# Patient Record
Sex: Female | Born: 1935 | Race: Black or African American | Hispanic: No | Smoking: Former smoker
Health system: Southern US, Community
[De-identification: ages and names within clinical notes are randomized; demographics above are authoritative.]

## PROBLEM LIST (undated history)

## (undated) DIAGNOSIS — K5900 Constipation, unspecified: Secondary | ICD-10-CM

## (undated) DIAGNOSIS — I10 Essential (primary) hypertension: Secondary | ICD-10-CM

## (undated) DIAGNOSIS — M1990 Unspecified osteoarthritis, unspecified site: Secondary | ICD-10-CM

## (undated) HISTORY — PX: CATARACT EXTRACTION, BILATERAL: SHX1313

## (undated) HISTORY — PX: ABDOMINAL HYSTERECTOMY: SHX81

---

## 2004-12-07 ENCOUNTER — Emergency Department: Payer: Self-pay | Admitting: Emergency Medicine

## 2005-06-30 ENCOUNTER — Ambulatory Visit: Payer: Self-pay | Admitting: Family Medicine

## 2006-03-26 ENCOUNTER — Ambulatory Visit: Payer: Self-pay | Admitting: Ophthalmology

## 2006-03-30 ENCOUNTER — Ambulatory Visit: Payer: Self-pay | Admitting: Ophthalmology

## 2006-11-04 ENCOUNTER — Ambulatory Visit: Payer: Self-pay | Admitting: Family Medicine

## 2007-08-22 ENCOUNTER — Ambulatory Visit: Payer: Self-pay | Admitting: Family Medicine

## 2008-06-29 ENCOUNTER — Emergency Department: Payer: Self-pay | Admitting: Internal Medicine

## 2008-06-29 ENCOUNTER — Other Ambulatory Visit: Payer: Self-pay

## 2009-06-26 ENCOUNTER — Ambulatory Visit: Payer: Self-pay | Admitting: Gastroenterology

## 2010-01-06 ENCOUNTER — Ambulatory Visit: Payer: Self-pay | Admitting: Family Medicine

## 2010-10-29 ENCOUNTER — Ambulatory Visit: Payer: Self-pay | Admitting: Family Medicine

## 2010-11-09 ENCOUNTER — Observation Stay: Payer: Self-pay | Admitting: Internal Medicine

## 2012-03-18 ENCOUNTER — Ambulatory Visit: Payer: Self-pay | Admitting: Family Medicine

## 2013-03-21 ENCOUNTER — Ambulatory Visit: Payer: Self-pay | Admitting: Family Medicine

## 2013-11-28 ENCOUNTER — Ambulatory Visit: Payer: Self-pay | Admitting: Family Medicine

## 2014-09-14 ENCOUNTER — Emergency Department: Payer: Self-pay | Admitting: Emergency Medicine

## 2014-10-09 ENCOUNTER — Ambulatory Visit: Payer: Self-pay | Admitting: Family Medicine

## 2015-02-09 ENCOUNTER — Emergency Department: Payer: Self-pay | Admitting: Emergency Medicine

## 2015-08-26 ENCOUNTER — Emergency Department
Admission: EM | Admit: 2015-08-26 | Discharge: 2015-08-26 | Disposition: A | Discharge status: Home / Self Care | Payer: Medicare Other | Attending: Emergency Medicine | Admitting: Emergency Medicine

## 2015-08-26 ENCOUNTER — Encounter: Payer: Self-pay | Admitting: Emergency Medicine

## 2015-08-26 ENCOUNTER — Emergency Department: Payer: Medicare Other

## 2015-08-26 DIAGNOSIS — Z87891 Personal history of nicotine dependence: Secondary | ICD-10-CM | POA: Diagnosis not present

## 2015-08-26 DIAGNOSIS — I10 Essential (primary) hypertension: Secondary | ICD-10-CM | POA: Diagnosis not present

## 2015-08-26 DIAGNOSIS — N390 Urinary tract infection, site not specified: Secondary | ICD-10-CM | POA: Insufficient documentation

## 2015-08-26 DIAGNOSIS — K5900 Constipation, unspecified: Secondary | ICD-10-CM | POA: Diagnosis present

## 2015-08-26 HISTORY — DX: Constipation, unspecified: K59.00

## 2015-08-26 HISTORY — DX: Essential (primary) hypertension: I10

## 2015-08-26 LAB — CBC WITH DIFFERENTIAL/PLATELET
BASOS ABS: 0.1 10*3/uL (ref 0–0.1)
Basophils Relative: 1 %
Eosinophils Absolute: 0.1 10*3/uL (ref 0–0.7)
Eosinophils Relative: 1 %
HCT: 39.9 % (ref 35.0–47.0)
HEMOGLOBIN: 13 g/dL (ref 12.0–16.0)
LYMPHS PCT: 34 %
Lymphs Abs: 2.1 10*3/uL (ref 1.0–3.6)
MCH: 27.6 pg (ref 26.0–34.0)
MCHC: 32.5 g/dL (ref 32.0–36.0)
MCV: 85 fL (ref 80.0–100.0)
Monocytes Absolute: 0.4 10*3/uL (ref 0.2–0.9)
Monocytes Relative: 7 %
NEUTROS PCT: 57 %
Neutro Abs: 3.5 10*3/uL (ref 1.4–6.5)
Platelets: 254 10*3/uL (ref 150–440)
RBC: 4.69 MIL/uL (ref 3.80–5.20)
RDW: 14.6 % — ABNORMAL HIGH (ref 11.5–14.5)
WBC: 6.1 10*3/uL (ref 3.6–11.0)

## 2015-08-26 LAB — BASIC METABOLIC PANEL
ANION GAP: 8 (ref 5–15)
BUN: 15 mg/dL (ref 6–20)
CHLORIDE: 102 mmol/L (ref 101–111)
CO2: 30 mmol/L (ref 22–32)
Calcium: 11.9 mg/dL — ABNORMAL HIGH (ref 8.9–10.3)
Creatinine, Ser: 0.97 mg/dL (ref 0.44–1.00)
GFR, EST AFRICAN AMERICAN: 57 mL/min — AB (ref >60)
GFR, EST NON AFRICAN AMERICAN: 50 mL/min — AB (ref >60)
Glucose, Bld: 98 mg/dL (ref 65–99)
POTASSIUM: 3.6 mmol/L (ref 3.5–5.1)
SODIUM: 140 mmol/L (ref 135–145)

## 2015-08-26 LAB — URINALYSIS COMPLETE WITH MICROSCOPIC (ARMC ONLY)
Bilirubin Urine: NEGATIVE
GLUCOSE, UA: NEGATIVE mg/dL
Ketones, ur: NEGATIVE mg/dL
NITRITE: POSITIVE — AB
Protein, ur: NEGATIVE mg/dL
Specific Gravity, Urine: 1.014 (ref 1.005–1.030)
pH: 6 (ref 5.0–8.0)

## 2015-08-26 MED ORDER — CEPHALEXIN 500 MG PO CAPS: 500.0000 mg | ORAL_CAPSULE | Freq: Three times a day (TID) | ORAL | Status: AC

## 2015-08-26 MED ORDER — FLEET ENEMA 7-19 GM/118ML RE ENEM
1.0000 | ENEMA | Freq: Once | RECTAL | Status: AC
  Administered 2015-08-26: 1 via RECTAL

## 2015-08-26 NOTE — Discharge Instructions (Signed)
Constipation Constipation is when a person:  Poops (has a bowel movement) less than 3 times a week.  Has a hard time pooping.  Has poop that is dry, hard, or bigger than normal. HOME CARE   Eat foods with a lot of fiber in them. This includes fruits, vegetables, beans, and whole grains such as brown rice.  Avoid fatty foods and foods with a lot of sugar. This includes french fries, hamburgers, cookies, candy, and soda.  If you are not getting enough fiber from food, take products with added fiber in them (supplements).  Drink enough fluid to keep your pee (urine) clear or pale yellow.  Exercise on a regular basis, or as told by your doctor.  Go to the restroom when you feel like you need to poop. Do not hold it.  Only take medicine as told by your doctor. Do not take medicines that help you poop (laxatives) without talking to your doctor first. GET HELP RIGHT AWAY IF:   You have bright red blood in your poop (stool).  Your constipation lasts more than 4 days or gets worse.  You have belly (abdominal) or butt (rectal) pain.  You have thin poop (as thin as a pencil).  You lose weight, and it cannot be explained. MAKE SURE YOU:   Understand these instructions.  Will watch your condition.  Will get help right away if you are not doing well or get worse. Document Released: 05/04/2008 Document Revised: 11/21/2013 Document Reviewed: 08/28/2013 [facility] Patient Information 2015 [place], [place]. This information is not intended to replace advice given to you by your health care provider. Make sure you discuss any questions you have with your health care provider.  Urinary Tract Infection A urinary tract infection (UTI) can occur any place along the urinary tract. The tract includes the kidneys, ureters, bladder, and urethra. A type of germ called bacteria often causes a UTI. UTIs are often helped with antibiotic medicine.  HOME CARE   If given, take antibiotics as told by your  doctor. Finish them even if you start to feel better.  Drink enough fluids to keep your pee (urine) clear or pale yellow.  Avoid tea, drinks with caffeine, and bubbly (carbonated) drinks.  Pee often. Avoid holding your pee in for a long time.  Pee before and after having sex (intercourse).  Wipe from front to back after you poop (bowel movement) if you are a woman. Use each tissue only once. GET HELP RIGHT AWAY IF:   You have back pain.  You have lower belly (abdominal) pain.  You have chills.  You feel sick to your stomach (nauseous).  You throw up (vomit).  Your burning or discomfort with peeing does not go away.  You have a fever.  Your symptoms are not better in 3 days. MAKE SURE YOU:   Understand these instructions.  Will watch your condition.  Will get help right away if you are not doing well or get worse. Document Released: 05/04/2008 Document Revised: 08/10/2012 Document Reviewed: 06/16/2012 [facility] Patient Information 2015 [place], [place]. This information is not intended to replace advice given to you by your health care provider. Make sure you discuss any questions you have with your health care provider.

## 2015-08-26 NOTE — ED Notes (Signed)
Pt to ed with c/o constipation x 2 days.  Per family pt does not eat frequently.  Pt reports she has tried over the counter meds but without relief of constipation.

## 2015-08-26 NOTE — ED Provider Notes (Signed)
Time Seen: Approximately ----------------------------------------- 3:27 PM on 08/26/2015 -----------------------------------------     I have reviewed the triage notes  Chief Complaint: Constipation   History of Present Illness: Kimberly Stokes is a 81 y.o. female who presents with a 2 day history of constipation. She states she normally has a bowel movement once a day and has some feelings of constipation. Patient's on MiraLAX and has also noticed some decreased urinary frequency and maybe some mild pain with urination. No fever, only mild right-sided lower abdominal pain. Patient denies any recent weight loss or night sweats. She denies any back or flank pain. She denies any weakness in either upper or lower extremities. She denies any nausea, vomiting. Past Medical History  Diagnosis Date  . Constipation   . Hypertension     There are no active problems to display for this patient.   History reviewed. No pertinent past surgical history.  History reviewed. No pertinent past surgical history.  No current outpatient prescriptions on file.  Allergies:  Review of patient's allergies indicates no known allergies.  Family History: History reviewed. No pertinent family history.  Social History: Social History  Substance Use Topics  . Smoking status: Former Games developer  . Smokeless tobacco: None  . Alcohol Use: No     Review of Systems:   10 point review of systems was performed and was otherwise negative:  Constitutional: No fever Eyes: No visual disturbances ENT: No sore throat, ear pain Cardiac: No chest pain Respiratory: No shortness of breath, wheezing, or stridor Abdomen: No abdominal pain, no vomiting, No diarrhea Endocrine: No weight loss, No night sweats Extremities: No peripheral edema, cyanosis Skin: No rashes, easy bruising Neurologic: No focal weakness, trouble with speech or swollowing Urologic: No dysuria, Hematuria, or urinary frequency   Physical  Exam:  ED Triage Vitals  Enc Vitals Group     BP 08/26/15 1204 143/99 mmHg     Pulse Rate 08/26/15 1204 57     Resp 08/26/15 1204 20     Temp 08/26/15 1204 97.7 F (36.5 C)     Temp Source 08/26/15 1204 Oral     SpO2 08/26/15 1204 99 %     Weight 08/26/15 1204 119 lb (53.978 kg)     Height 08/26/15 1204  (1.651 m)     Head Cir --      Peak Flow --      Pain Score 08/26/15 1205 0     Pain Loc --      Pain Edu? --      Excl. in GC? --     General: Awake , Alert , and Oriented times 3; GCS 15 Head: Normal cephalic , atraumatic Eyes: Pupils equal , round, reactive to light Nose/Throat: No nasal drainage, patent upper airway without erythema or exudate.  Neck: Supple, Full range of motion, No anterior adenopathy or palpable thyroid masses Lungs: Clear to ascultation without wheezes , rhonchi, or rales Heart: Regular rate, regular rhythm without murmurs , gallops , or rubs Abdomen: Soft, mild tenderness to deep palpation without rebound, guarding , or rigidity; bowel sounds positive and symmetric in all 4 quadrants. No organomegaly .       No palpable masses. Extremities: 2 plus symmetric pulses. No edema, clubbing or cyanosis Neurologic: normal ambulation, Motor symmetric without deficits, sensory intact Skin: warm, dry, no rashes   Labs:   All laboratory work was reviewed including any pertinent negatives or positives listed below:  Labs Reviewed  URINALYSIS COMPLETEWITH  MICROSCOPIC ([facility] ONLY) - Abnormal; Notable for the following:    Color, Urine YELLOW (*)    APPearance CLEAR (*)    Hgb urine dipstick 1+ (*)    Nitrite POSITIVE (*)    Leukocytes, UA TRACE (*)    Bacteria, UA RARE (*)    Squamous Epithelial / LPF 0-5 (*)    All other components within normal limits  URINE CULTURE  BASIC METABOLIC PANEL  CBC WITH DIFFERENTIAL/PLATELET   reviewed the laboratory work shows an elevated calcium. Otherwise all within normal limits She does have findings consistent  with a urinary tract infection and a urine culture was added  Radiology:   EXAM: ABDOMEN - 2 VIEW  COMPARISON: None.  FINDINGS: Supine and upright images obtained. There is moderate stool in colon. There is no bowel dilatation or air-fluid level suggesting obstruction. No free air. There are multiple laminated gallstones in the gallbladder. Lung bases clear. There is degenerative change in the lumbar spine.  IMPRESSION: Laminated gallstones in the gallbladder. Note that the gallbladder is rather mobile given the difference in position of the laminated gallstones between supine and upright images. The bowel gas pattern overall is unremarkable. There is moderate stool in the colon.      I personally reviewed the radiologic studies   Procedures:  Patient received a fleets enema with some normal bowel movement here in emergency department. There was no blood etc. Patient felt symptomatically improved     ED Course:  Patient's stay here was uneventful and I felt her abdominal pain was not secondary to a surgical etiology. Patient does have a mild urinary tract infection will be started on Keflex on an outpatient basis with urine culture pending. Patient's constipation I does not seem to be neurologic in nature though of concern is a slightly elevated calcium level. Patient may need some follow-up for colonoscopy.    Assessment: 1. Acute urinary tract infection 2. Constipation 3. Hypercalcemia   Final Clinical Impression:   Final diagnoses:  Constipation     Plan:  Outpatient management Patient was advised to return immediately if condition worsens. Patient was advised to follow up with her primary care physician or other specialized physicians involved and in their current assessment.             Jennye Moccasin, MD 08/26/15 [phone]

## 2015-08-28 LAB — URINE CULTURE

## 2016-08-12 ENCOUNTER — Encounter: Payer: Self-pay | Admitting: Emergency Medicine

## 2016-08-12 ENCOUNTER — Emergency Department
Admission: EM | Admit: 2016-08-12 | Discharge: 2016-08-12 | Disposition: A | Discharge status: Home / Self Care | Payer: Medicare Other | Attending: Emergency Medicine | Admitting: Emergency Medicine

## 2016-08-12 DIAGNOSIS — R34 Anuria and oliguria: Secondary | ICD-10-CM | POA: Insufficient documentation

## 2016-08-12 DIAGNOSIS — Z87891 Personal history of nicotine dependence: Secondary | ICD-10-CM | POA: Insufficient documentation

## 2016-08-12 DIAGNOSIS — I10 Essential (primary) hypertension: Secondary | ICD-10-CM | POA: Insufficient documentation

## 2016-08-12 DIAGNOSIS — R3989 Other symptoms and signs involving the genitourinary system: Secondary | ICD-10-CM

## 2016-08-12 HISTORY — DX: Unspecified osteoarthritis, unspecified site: M19.90

## 2016-08-12 LAB — URINALYSIS COMPLETE WITH MICROSCOPIC (ARMC ONLY)
BACTERIA UA: NONE SEEN
BILIRUBIN URINE: NEGATIVE
Glucose, UA: NEGATIVE mg/dL
HGB URINE DIPSTICK: NEGATIVE
Ketones, ur: NEGATIVE mg/dL
LEUKOCYTES UA: NEGATIVE
NITRITE: NEGATIVE
PH: 5 (ref 5.0–8.0)
PROTEIN: NEGATIVE mg/dL
Specific Gravity, Urine: 1.009 (ref 1.005–1.030)

## 2016-08-12 LAB — BASIC METABOLIC PANEL
ANION GAP: 6 (ref 5–15)
BUN: 22 mg/dL — ABNORMAL HIGH (ref 6–20)
CALCIUM: 10.6 mg/dL — AB (ref 8.9–10.3)
CO2: 27 mmol/L (ref 22–32)
CREATININE: 1.09 mg/dL — AB (ref 0.44–1.00)
Chloride: 105 mmol/L (ref 101–111)
GFR, EST AFRICAN AMERICAN: 49 mL/min — AB (ref >60)
GFR, EST NON AFRICAN AMERICAN: 43 mL/min — AB (ref >60)
Glucose, Bld: 101 mg/dL — ABNORMAL HIGH (ref 65–99)
Potassium: 3.5 mmol/L (ref 3.5–5.1)
Sodium: 138 mmol/L (ref 135–145)

## 2016-08-12 NOTE — ED Notes (Signed)
Bladder scan showed 38 cc, pt reports voiding prior to arrival.

## 2016-08-12 NOTE — Discharge Instructions (Signed)
You were seen in the ER for urinary problems. Blood work and urinalysis look fine. He will be called with any positive urine cultures. Return to the ER for worsening symptoms, persistent vomiting, difficulty breathing or other concerns.

## 2016-08-12 NOTE — ED Provider Notes (Signed)
[facility] Emergency Department Provider Note   ____________________________________________   First MD Initiated Contact with Patient 08/12/16 2319     (approximate)  I have reviewed the triage vital signs and the nursing notes.   HISTORY  Chief Complaint Urinary Retention    HPI Kimberly Stokes is a 82 y.o. female who presents to the ED from home with a chief complaint of "when I pee, I just pee a little bit".Patient reports symptoms 1 day. Denies associated frequency, dysuria, hematuria. Denies fever, chills, chest pain, shortness of breath, abdominal pain, nausea, vomiting, diarrhea, constipation. States she just noticed today that she is producing little urine each time she urinates, which is unusual for her. Had soft bowel movements prior to arrival does not feel like she is constipated. Denies recent travel or trauma. Does take a fluid pill. Nothing makes her symptoms better or worse.   Past Medical History:  Diagnosis Date  . Arthritis   . Constipation   . Hypertension     There are no active problems to display for this patient.   Past Surgical History:  Procedure Laterality Date  . ABDOMINAL HYSTERECTOMY    . CATARACT EXTRACTION, BILATERAL      Prior to Admission medications   Not on File    Allergies Review of patient's allergies indicates no known allergies.  No family history on file.  Social History Social History  Substance Use Topics  . Smoking status: Former Games developermoker  . Smokeless tobacco: Never Used  . Alcohol use No    Review of Systems  Constitutional: No fever/chills. Eyes: No visual changes. ENT: No sore throat. Cardiovascular: Denies chest pain. Respiratory: Denies shortness of breath. Gastrointestinal: No abdominal pain.  No nausea, no vomiting.  No diarrhea.  No constipation. Genitourinary: Positive for urinating small quantity. Negative for dysuria. Musculoskeletal: Negative for back pain. Skin:  Negative for rash. Neurological: Negative for headaches, focal weakness or numbness.  10-point ROS otherwise negative.  ____________________________________________   PHYSICAL EXAM:  VITAL SIGNS: ED Triage Vitals [08/12/16 2130]  Enc Vitals Group     BP (!) 144/72     Pulse Rate (!) 56     Resp 18     Temp 97.6 F (36.4 C)     Temp Source Oral     SpO2 99 %     Weight 112 lb (50.8 kg)     Height 5\' 2"  (1.575 m)     Head Circumference      Peak Flow      Pain Score      Pain Loc      Pain Edu?      Excl. in GC?     Constitutional: Alert and oriented. Well appearing and in no acute distress. Eyes: Conjunctivae are normal. PERRL. EOMI. Head: Atraumatic. Nose: No congestion/rhinnorhea. Mouth/Throat: Mucous membranes are moist.  Oropharynx non-erythematous. Neck: No stridor.   Cardiovascular: Normal rate, regular rhythm. Grossly normal heart sounds.  Good peripheral circulation. Respiratory: Normal respiratory effort.  No retractions. Lungs CTAB. Gastrointestinal: Soft and nontender to light and deep palpation. No distention. No abdominal bruits. No CVA tenderness. Musculoskeletal: No lower extremity tenderness nor edema.  No joint effusions. Neurologic:  Normal speech and language. No gross focal neurologic deficits are appreciated. No gait instability. Skin:  Skin is warm, dry and intact. No rash noted. Psychiatric: Mood and affect are normal. Speech and behavior are normal.  ____________________________________________   LABS (all labs ordered are listed, but only  abnormal results are displayed)  Labs Reviewed  URINALYSIS COMPLETEWITH MICROSCOPIC ([facility] ONLY) - Abnormal; Notable for the following:       Result Value   Color, Urine STRAW (*)    APPearance CLEAR (*)    Squamous Epithelial / LPF 0-5 (*)    All other components within normal limits  BASIC METABOLIC PANEL - Abnormal; Notable for the following:    Glucose, Bld 101 (*)    BUN 22 (*)    Creatinine,  Ser 1.09 (*)    Calcium 10.6 (*)    GFR calc non Af Amer 43 (*)    GFR calc Af Amer 49 (*)    All other components within normal limits  URINE CULTURE   ____________________________________________  EKG  None ____________________________________________  RADIOLOGY  None ____________________________________________   PROCEDURES  Procedure(s) performed: None  Procedures  Critical Care performed: No  ____________________________________________   INITIAL IMPRESSION / ASSESSMENT AND PLAN / ED COURSE  Pertinent labs & imaging results that were available during my care of the patient were reviewed by me and considered in my medical decision making (see chart for details).  82 year old female who presents with a chief complaint of urinating smaller quantity than usual. Post void bladder scan only demonstrates 38 cc. Laboratory and urinalysis results are unremarkable. Will add urine culture. Patient is well-appearing, in no acute distress, without abdominal tenderness on exam. Strict return precautions given. Patient daughter verbalize understanding and agree with plan of care.  Clinical Course     ____________________________________________   FINAL CLINICAL IMPRESSION(S) / ED DIAGNOSES  Final diagnoses:  Urinary problem      NEW MEDICATIONS STARTED DURING THIS VISIT:  New Prescriptions   No medications on file     Note:  This document was prepared using Dragon voice recognition software and may include unintentional dictation errors.    Irean Hong, MD 08/13/16 [phone]

## 2016-08-12 NOTE — ED Triage Notes (Signed)
Pt ambulatory to triage with steady gait with c/o urinary retention starting today. Pt states "when I pee, I just pee a little bit." Pt denies dysuria or hematuria. Pt denies abdominal pain or chest pain.

## 2016-08-14 LAB — URINE CULTURE
Culture: NO GROWTH
SPECIAL REQUESTS: NORMAL

## 2017-01-16 ENCOUNTER — Emergency Department: Payer: Medicare Other

## 2017-01-16 ENCOUNTER — Emergency Department
Admission: EM | Admit: 2017-01-16 | Discharge: 2017-01-16 | Disposition: A | Discharge status: Home / Self Care | Payer: Medicare Other | Attending: Emergency Medicine | Admitting: Emergency Medicine

## 2017-01-16 ENCOUNTER — Encounter: Payer: Self-pay | Admitting: Emergency Medicine

## 2017-01-16 DIAGNOSIS — Z87891 Personal history of nicotine dependence: Secondary | ICD-10-CM | POA: Diagnosis not present

## 2017-01-16 DIAGNOSIS — R1110 Vomiting, unspecified: Secondary | ICD-10-CM | POA: Diagnosis not present

## 2017-01-16 DIAGNOSIS — R1031 Right lower quadrant pain: Secondary | ICD-10-CM | POA: Diagnosis not present

## 2017-01-16 DIAGNOSIS — I10 Essential (primary) hypertension: Secondary | ICD-10-CM | POA: Insufficient documentation

## 2017-01-16 DIAGNOSIS — R197 Diarrhea, unspecified: Secondary | ICD-10-CM | POA: Diagnosis not present

## 2017-01-16 LAB — URINALYSIS, COMPLETE (UACMP) WITH MICROSCOPIC
BILIRUBIN URINE: NEGATIVE
Bacteria, UA: NONE SEEN
Glucose, UA: NEGATIVE mg/dL
Ketones, ur: NEGATIVE mg/dL
Leukocytes, UA: NEGATIVE
Nitrite: NEGATIVE
PH: 5 (ref 5.0–8.0)
Protein, ur: 30 mg/dL — AB
SPECIFIC GRAVITY, URINE: 1.021 (ref 1.005–1.030)

## 2017-01-16 LAB — COMPREHENSIVE METABOLIC PANEL
ALK PHOS: 40 U/L (ref 38–126)
ALT: 8 U/L — AB (ref 14–54)
AST: 23 U/L (ref 15–41)
Albumin: 4.2 g/dL (ref 3.5–5.0)
Anion gap: 10 (ref 5–15)
BUN: 20 mg/dL (ref 6–20)
CALCIUM: 11.4 mg/dL — AB (ref 8.9–10.3)
CO2: 27 mmol/L (ref 22–32)
CREATININE: 1.13 mg/dL — AB (ref 0.44–1.00)
Chloride: 101 mmol/L (ref 101–111)
GFR, EST AFRICAN AMERICAN: 47 mL/min — AB (ref >60)
GFR, EST NON AFRICAN AMERICAN: 41 mL/min — AB (ref >60)
Glucose, Bld: 117 mg/dL — ABNORMAL HIGH (ref 65–99)
Potassium: 3.5 mmol/L (ref 3.5–5.1)
Sodium: 138 mmol/L (ref 135–145)
Total Bilirubin: 0.5 mg/dL (ref 0.3–1.2)
Total Protein: 7.5 g/dL (ref 6.5–8.1)

## 2017-01-16 LAB — LIPASE, BLOOD: Lipase: 28 U/L (ref 11–51)

## 2017-01-16 LAB — CBC
HCT: 38.2 % (ref 35.0–47.0)
Hemoglobin: 12.8 g/dL (ref 12.0–16.0)
MCH: 28.8 pg (ref 26.0–34.0)
MCHC: 33.4 g/dL (ref 32.0–36.0)
MCV: 86.3 fL (ref 80.0–100.0)
PLATELETS: 266 10*3/uL (ref 150–440)
RBC: 4.43 MIL/uL (ref 3.80–5.20)
RDW: 13.4 % (ref 11.5–14.5)
WBC: 4.3 10*3/uL (ref 3.6–11.0)

## 2017-01-16 MED ORDER — FAMOTIDINE 20 MG PO TABS: 20.0000 mg | ORAL_TABLET | Freq: Two times a day (BID) | ORAL | 0 refills | Status: DC

## 2017-01-16 MED ORDER — DOCUSATE SODIUM 100 MG PO CAPS: 200.0000 mg | ORAL_CAPSULE | Freq: Two times a day (BID) | ORAL | 0 refills | Status: DC

## 2017-01-16 MED ORDER — IOPAMIDOL (ISOVUE-370) INJECTION 76%
80.0000 mL | Freq: Once | INTRAVENOUS | Status: AC | PRN
  Administered 2017-01-16: 80 mL via INTRAVENOUS

## 2017-01-16 NOTE — ED Provider Notes (Signed)
[facility] Emergency Department Provider Note  ____________________________________________  Time seen: Approximately 10:04 AM  I have reviewed the triage vital signs and the nursing notes.   HISTORY  Chief Complaint Abdominal Pain    HPI Kimberly Stokes is a 83 y.o. female who complains of intermittent right lower quadrant pain that happens after eating and lasts for several minutes. Described as achy. Nonradiating. No alleviating factors. Comes because she has a history of gallstones and is concerned this could be her gallbladder. No fevers chills or vomiting. Currently pain free[facility]     Past Medical History:  Diagnosis Date  . Arthritis   . Constipation   . Hypertension      There are no active problems to display for this patient.    Past Surgical History:  Procedure Laterality Date  . ABDOMINAL HYSTERECTOMY    . CATARACT EXTRACTION, BILATERAL       Prior to Admission medications   Medication Sig Start Date End Date Taking? Authorizing Provider  docusate sodium (COLACE) 100 MG capsule Take 2 capsules (200 mg total) by mouth 2 (two) times daily. 01/16/17   Sharman CheekPhillip Whitman Meinhardt, MD  famotidine (PEPCID) 20 MG tablet Take 1 tablet (20 mg total) by mouth 2 (two) times daily. 01/16/17   Sharman CheekPhillip Fionn Stracke, MD  None   Allergies Patient has no known allergies.   No family history on file.  Social History Social History  Substance Use Topics  . Smoking status: Former Games developermoker  . Smokeless tobacco: Never Used  . Alcohol use No    Review of Systems  Constitutional:   No fever or chills.  ENT:   No sore throat. No rhinorrhea. Cardiovascular:   No chest pain. Respiratory:   No dyspnea or cough. Gastrointestinal:   Positive as above for right lower quadrant abdominal pain without vomiting and diarrhea.  Genitourinary:   Negative for dysuria or difficulty urinating. Musculoskeletal:   Negative for focal pain or  swelling Neurological:   Negative for headaches 10-point ROS otherwise negative.  ____________________________________________   PHYSICAL EXAM:  VITAL SIGNS: ED Triage Vitals  Enc Vitals Group     BP 01/16/17 0819 139/73     Pulse Rate 01/16/17 0819 69     Resp 01/16/17 0819 18     Temp 01/16/17 0819 98.5 F (36.9 C)     Temp Source 01/16/17 0819 Oral     SpO2 01/16/17 0819 99 %     Weight 01/16/17 0820 114 lb (51.7 kg)     Height 01/16/17 0820 5\' 6"  (1.676 m)     Head Circumference --      Peak Flow --      Pain Score 01/16/17 0820 7     Pain Loc --      Pain Edu? --      Excl. in GC? --     Vital signs reviewed, nursing assessments reviewed.   Constitutional:   Alert and oriented. Well appearing and in no distress. Eyes:   No scleral icterus. No conjunctival pallor. PERRL. EOMI.  No nystagmus. ENT   Head:   Normocephalic and atraumatic.   Nose:   No congestion/rhinnorhea. No septal hematoma   Mouth/Throat:   MMM, no pharyngeal erythema. No peritonsillar mass.    Neck:   No stridor. No SubQ emphysema. No meningismus. Hematological/Lymphatic/Immunilogical:   No cervical lymphadenopathy. Cardiovascular:   RRR. Symmetric bilateral radial and DP pulses.  No murmurs.  Respiratory:   Normal  respiratory effort without tachypnea nor retractions. Breath sounds are clear and equal bilaterally. No wheezes/rales/rhonchi. Gastrointestinal:   Soft and nontender. Non distended. There is no CVA tenderness.  No rebound, rigidity, or guarding. Genitourinary:   deferred Musculoskeletal:   Nontender with normal range of motion in all extremities. No joint effusions.  No lower extremity tenderness.  No edema. Neurologic:   Normal speech and language.  CN 2-10 normal. Motor grossly intact. No gross focal neurologic deficits are appreciated.  Skin:    Skin is warm, dry and intact. No rash noted.  No petechiae, purpura, or  bullae.  ____________________________________________    LABS (pertinent positives/negatives) (all labs ordered are listed, but only abnormal results are displayed) Labs Reviewed  COMPREHENSIVE METABOLIC PANEL - Abnormal; Notable for the following:       Result Value   Glucose, Bld 117 (*)    Creatinine, Ser 1.13 (*)    Calcium 11.4 (*)    ALT 8 (*)    GFR calc non Af Amer 41 (*)    GFR calc Af Amer 47 (*)    All other components within normal limits  URINALYSIS, COMPLETE (UACMP) WITH MICROSCOPIC - Abnormal; Notable for the following:    Color, Urine YELLOW (*)    APPearance CLEAR (*)    Hgb urine dipstick SMALL (*)    Protein, ur 30 (*)    Squamous Epithelial / LPF 0-5 (*)    All other components within normal limits  LIPASE, BLOOD  CBC   ____________________________________________   EKG    ____________________________________________    RADIOLOGY  CT angiogram abdomen pelvis unremarkable. There is some mild atherosclerotic disease of the arterial system but not flow-limiting.  ____________________________________________   PROCEDURES Procedures Peripheral IV insertion by physician Indication: Multiple failed attempts by nursing staff, need for IV access and/or blood samples for workup Performed under continuous real-time ultrasound visualization Area cleaned with chlorhexidine. 20-gauge IV successfully placed in the right antecubital fossa. 1 attempt, no complications, EBL 0.  ____________________________________________   INITIAL IMPRESSION / ASSESSMENT AND PLAN / ED COURSE  Pertinent labs & imaging results that were available during my care of the patient were reviewed by me and considered in my medical decision making (see chart for details).  Patient well appearing no acute distress, vital signs unremarkable, exam benign and reassuring. CT angiogram obtained due to history of being possibly consistent with intestinal angina, but this was  unremarkable. Very low suspicion of cholecystitis biliary disease perforation obstruction or any other significant abdominal pathology. Abdomen is nonsurgical. We'll discharge home to follow up with primary care. Trial of stool softener and Pepcid.     ____________________________________________   FINAL CLINICAL IMPRESSION(S) / ED DIAGNOSES  Final diagnoses:  Right lower quadrant abdominal pain      New Prescriptions   DOCUSATE SODIUM (COLACE) 100 MG CAPSULE    Take 2 capsules (200 mg total) by mouth 2 (two) times daily.   FAMOTIDINE (PEPCID) 20 MG TABLET    Take 1 tablet (20 mg total) by mouth 2 (two) times daily.     Portions of this note were generated with dragon dictation software. Dictation errors may occur despite best attempts at proofreading.    Sharman Cheek, MD 01/16/17 1137

## 2017-01-16 NOTE — Discharge Instructions (Signed)
Your lab tests and CT angiogram of the abdomen were unremarkable today. Follow up with your primary care doctor for continued management of your symptoms.

## 2017-01-16 NOTE — ED Triage Notes (Signed)
Lower R abdominal pain x 2 to 3 weeks.

## 2017-01-18 ENCOUNTER — Other Ambulatory Visit: Payer: Self-pay | Admitting: Family Medicine

## 2017-01-18 DIAGNOSIS — K8020 Calculus of gallbladder without cholecystitis without obstruction: Secondary | ICD-10-CM

## 2017-01-21 ENCOUNTER — Ambulatory Visit
Admission: RE | Admit: 2017-01-21 | Discharge: 2017-01-21 | Disposition: A | Discharge status: Home / Self Care | Payer: Medicare Other | Source: Ambulatory Visit | Attending: Family Medicine | Admitting: Family Medicine

## 2017-01-21 DIAGNOSIS — K8020 Calculus of gallbladder without cholecystitis without obstruction: Secondary | ICD-10-CM | POA: Insufficient documentation

## 2017-04-22 ENCOUNTER — Encounter: Payer: Self-pay | Admitting: Emergency Medicine

## 2017-04-22 ENCOUNTER — Emergency Department: Payer: Medicare Other

## 2017-04-22 ENCOUNTER — Emergency Department
Admission: EM | Admit: 2017-04-22 | Discharge: 2017-04-22 | Disposition: A | Discharge status: Home / Self Care | Payer: Medicare Other | Attending: Emergency Medicine | Admitting: Emergency Medicine

## 2017-04-22 DIAGNOSIS — R002 Palpitations: Secondary | ICD-10-CM | POA: Insufficient documentation

## 2017-04-22 DIAGNOSIS — I10 Essential (primary) hypertension: Secondary | ICD-10-CM | POA: Diagnosis not present

## 2017-04-22 DIAGNOSIS — Z87891 Personal history of nicotine dependence: Secondary | ICD-10-CM | POA: Insufficient documentation

## 2017-04-22 DIAGNOSIS — Z79899 Other long term (current) drug therapy: Secondary | ICD-10-CM | POA: Insufficient documentation

## 2017-04-22 LAB — TROPONIN I

## 2017-04-22 LAB — CBC
HEMATOCRIT: 38.1 % (ref 35.0–47.0)
Hemoglobin: 12.6 g/dL (ref 12.0–16.0)
MCH: 28.8 pg (ref 26.0–34.0)
MCHC: 33 g/dL (ref 32.0–36.0)
MCV: 87.2 fL (ref 80.0–100.0)
Platelets: 233 10*3/uL (ref 150–440)
RBC: 4.37 MIL/uL (ref 3.80–5.20)
RDW: 13.4 % (ref 11.5–14.5)
WBC: 4.1 10*3/uL (ref 3.6–11.0)

## 2017-04-22 LAB — BASIC METABOLIC PANEL
Anion gap: 6 (ref 5–15)
BUN: 20 mg/dL (ref 6–20)
CALCIUM: 10.9 mg/dL — AB (ref 8.9–10.3)
CO2: 29 mmol/L (ref 22–32)
Chloride: 102 mmol/L (ref 101–111)
Creatinine, Ser: 1.15 mg/dL — ABNORMAL HIGH (ref 0.44–1.00)
GFR calc Af Amer: 46 mL/min — ABNORMAL LOW (ref >60)
GFR, EST NON AFRICAN AMERICAN: 40 mL/min — AB (ref >60)
Glucose, Bld: 113 mg/dL — ABNORMAL HIGH (ref 65–99)
POTASSIUM: 3.7 mmol/L (ref 3.5–5.1)
Sodium: 137 mmol/L (ref 135–145)

## 2017-04-22 NOTE — ED Provider Notes (Signed)
[facility] Emergency Department Provider Note  ____________________________________________  Time seen: Approximately 11:29 AM  I have reviewed the triage vital signs and the nursing notes.   HISTORY  Chief Complaint Chest Pain    HPI Kimberly Stokes is a 83 y.o. female reports at about 6:30 this morning while she was making her bed she felt palpitations in her chest. Felt like her heart was racing. No chest pain or shortness of breath. No vomiting or diaphoresis. No dizziness or syncope. She is otherwise in her usual state of health. She's never had anything like this before. Denies any history of heart disease. After but now the symptoms resolved. No complaints of this time. It was constant during that time without any aggravating or alleviating factors and mild in intensity.     Past Medical History:  Diagnosis Date  . Arthritis   . Constipation   . Hypertension      There are no active problems to display for this patient.    Past Surgical History:  Procedure Laterality Date  . ABDOMINAL HYSTERECTOMY    . CATARACT EXTRACTION, BILATERAL       Prior to Admission medications   Medication Sig Start Date End Date Taking? Authorizing Provider  amLODipine (NORVASC) 10 MG tablet Take 10 mg by mouth daily.   Yes [provider]  aspirin 81 MG chewable tablet Chew 81 mg by mouth daily.   Yes [provider]  chlorthalidone (HYGROTON) 25 MG tablet Take 25 mg by mouth daily.   Yes [provider]  Cholecalciferol (D3-1000) 1000 units capsule Take 1,000 Units by mouth daily.   Yes [provider]  metoprolol succinate (TOPROL-XL) 50 MG 24 hr tablet Take 50 mg by mouth daily. Take with or immediately following a meal.   Yes [provider]  polyethylene glycol (MIRALAX / GLYCOLAX) packet Take 17 g by mouth daily.   Yes [provider]  docusate sodium (COLACE) 100 MG capsule Take 2 capsules (200 mg  total) by mouth 2 (two) times daily. Patient not taking: Reported on 04/22/2017 01/16/17   Sharman Cheek, MD  famotidine (PEPCID) 20 MG tablet Take 1 tablet (20 mg total) by mouth 2 (two) times daily. Patient not taking: Reported on 04/22/2017 01/16/17   Sharman Cheek, MD     Allergies Patient has no known allergies.   History reviewed. No pertinent family history.  Social History Social History  Substance Use Topics  . Smoking status: Former Games developer  . Smokeless tobacco: Never Used  . Alcohol use No    Review of Systems  Constitutional:   No fever or chills.  ENT:   No sore throat. No rhinorrhea. Cardiovascular:   No chest pain or syncope.positive palpitations as above Respiratory:   No dyspnea or cough. Gastrointestinal:   Negative for abdominal pain, vomiting and diarrhea.  Musculoskeletal:   Negative for focal pain or swelling All other systems reviewed and are negative except as documented above in ROS and HPI.  ____________________________________________   PHYSICAL EXAM:  VITAL SIGNS: ED Triage Vitals  Enc Vitals Group     BP 04/22/17 0734 (!) 115/113     Pulse Rate 04/22/17 0733 78     Resp 04/22/17 0733 18     Temp 04/22/17 0734 98.2 F (36.8 C)     Temp Source 04/22/17 0734 Oral     SpO2 04/22/17 0733 100 %     Weight 04/22/17 0730 114 lb (51.7 kg)  Height 04/22/17 0730 5\' 4"  (1.626 m)     Head Circumference --      Peak Flow --      Pain Score --      Pain Loc --      Pain Edu? --      Excl. in GC? --     Vital signs reviewed, nursing assessments reviewed.   Constitutional:   Alert and oriented. Well appearing and in no distress. Eyes:   No scleral icterus. No conjunctival pallor. PERRL. EOMI.  No nystagmus. ENT   Head:   Normocephalic and atraumatic.   Nose:   No congestion/rhinnorhea.    Mouth/Throat:   MMM, no pharyngeal erythema. No peritonsillar mass.    Neck:   No meningismus. Full  ROM Hematological/Lymphatic/Immunilogical:   No cervical lymphadenopathy. Cardiovascular:   RRR. Symmetric bilateral radial and DP pulses.  No murmurs.  Respiratory:   Normal respiratory effort without tachypnea/retractions. Breath sounds are clear and equal bilaterally. No wheezes/rales/rhonchi. Gastrointestinal:   Soft and nontender. Non distended. There is no CVA tenderness.  No rebound, rigidity, or guarding. Genitourinary:   deferred Musculoskeletal:   Normal range of motion in all extremities. No joint effusions.  No lower extremity tenderness.  No edema. Neurologic:   Normal speech and language.   Motor grossly intact. No gross focal neurologic deficits are appreciated.  Skin:    Skin is warm, dry and intact. No rash noted.  No petechiae, purpura, or bullae.  ____________________________________________    LABS (pertinent positives/negatives) (all labs ordered are listed, but only abnormal results are displayed) Labs Reviewed  BASIC METABOLIC PANEL - Abnormal; Notable for the following:       Result Value   Glucose, Bld 113 (*)    Creatinine, Ser 1.15 (*)    Calcium 10.9 (*)    GFR calc non Af Amer 40 (*)    GFR calc Af Amer 46 (*)    All other components within normal limits  CBC  TROPONIN I  TROPONIN I   ____________________________________________   EKG  Interpreted by me Sinus rhythm rate of 77, normal axis and intervals. Normal QRS ST segments and T waves.  ____________________________________________    RADIOLOGY  Dg Chest 2 View  Result Date: 04/22/2017 CLINICAL DATA:  Acute left chest pain. EXAM: CHEST  2 VIEW COMPARISON:  11/09/2010 chest radiograph FINDINGS: The cardiomediastinal silhouette is unremarkable. There is no evidence of focal airspace disease, pulmonary edema, suspicious pulmonary nodule/mass, pleural effusion, or pneumothorax. No acute bony abnormalities are identified. IMPRESSION: No active cardiopulmonary disease. Electronically Signed    By: Harmon PierJeffrey  Hu M.D.   On: 04/22/2017 08:09    ____________________________________________   PROCEDURES Procedures  ____________________________________________   INITIAL IMPRESSION / ASSESSMENT AND PLAN / ED COURSE  Pertinent labs & imaging results that were available during my care of the patient were reviewed by me and considered in my medical decision making (see chart for details).   Clinical Course as of Apr 22 1128  Thu Apr 22, 2017  [id number] Presents with palpitations that started about 6:30 AM today and have resolved by 7:30. Denies any chest pain or other worrisome symptoms. Not exertional. We'll check labs. Patient in sinus rhythm.  [PS]  [id number] Labs unremarkable. Sx free. Vss. Will check delta trop, plan for cards f/u if neg.   [PS]    Clinical Course User Index [PS] Sharman CheekStafford, Cyla Haluska, MD    ----------------------------------------- 11:32 AM on 04/22/2017 -----------------------------------------  Repeat troponin negative. Remains in  sinus rhythm on telemetry in the ED. Vital signs stable. Follow-up primary care and cardiology.   ____________________________________________   FINAL CLINICAL IMPRESSION(S) / ED DIAGNOSES  Final diagnoses:  Palpitations      New Prescriptions   No medications on file     Portions of this note were generated with dragon dictation software. Dictation errors may occur despite best attempts at proofreading.    Sharman Cheek, MD 04/22/17 [phone]

## 2017-04-22 NOTE — ED Triage Notes (Signed)
Pt c/o feeling like heart racing. Denies pain or SHOB at this time. NAD. Skin warm and dry. Respirations unlabored.

## 2017-04-22 NOTE — Discharge Instructions (Signed)
Your blood tests and EKG were unremarkable today. Follow up with your doctor and cardiology for further evaluation of your symptoms.

## 2017-04-22 NOTE — ED Notes (Signed)
Pt assisted to toilet 

## 2017-12-09 ENCOUNTER — Encounter: Payer: Self-pay | Admitting: Emergency Medicine

## 2017-12-09 ENCOUNTER — Other Ambulatory Visit: Payer: Self-pay

## 2017-12-09 DIAGNOSIS — E876 Hypokalemia: Secondary | ICD-10-CM | POA: Diagnosis not present

## 2017-12-09 DIAGNOSIS — I10 Essential (primary) hypertension: Secondary | ICD-10-CM | POA: Diagnosis not present

## 2017-12-09 DIAGNOSIS — Z79899 Other long term (current) drug therapy: Secondary | ICD-10-CM | POA: Diagnosis not present

## 2017-12-09 DIAGNOSIS — R627 Adult failure to thrive: Secondary | ICD-10-CM | POA: Diagnosis not present

## 2017-12-09 DIAGNOSIS — Z87891 Personal history of nicotine dependence: Secondary | ICD-10-CM | POA: Insufficient documentation

## 2017-12-09 DIAGNOSIS — R002 Palpitations: Secondary | ICD-10-CM | POA: Diagnosis present

## 2017-12-09 DIAGNOSIS — Z7982 Long term (current) use of aspirin: Secondary | ICD-10-CM | POA: Insufficient documentation

## 2017-12-09 DIAGNOSIS — R630 Anorexia: Secondary | ICD-10-CM | POA: Insufficient documentation

## 2017-12-09 LAB — CBC
HEMATOCRIT: 39.2 % (ref 35.0–47.0)
Hemoglobin: 12.5 g/dL (ref 12.0–16.0)
MCH: 27.3 pg (ref 26.0–34.0)
MCHC: 32 g/dL (ref 32.0–36.0)
MCV: 85.4 fL (ref 80.0–100.0)
Platelets: 307 10*3/uL (ref 150–440)
RBC: 4.58 MIL/uL (ref 3.80–5.20)
RDW: 13.7 % (ref 11.5–14.5)
WBC: 5.3 10*3/uL (ref 3.6–11.0)

## 2017-12-09 LAB — COMPREHENSIVE METABOLIC PANEL
ALBUMIN: 4.3 g/dL (ref 3.5–5.0)
ALK PHOS: 52 U/L (ref 38–126)
ALT: 9 U/L — ABNORMAL LOW (ref 14–54)
ANION GAP: 10 (ref 5–15)
AST: 23 U/L (ref 15–41)
BILIRUBIN TOTAL: 0.5 mg/dL (ref 0.3–1.2)
BUN: 19 mg/dL (ref 6–20)
CALCIUM: 11.4 mg/dL — AB (ref 8.9–10.3)
CO2: 30 mmol/L (ref 22–32)
Chloride: 101 mmol/L (ref 101–111)
Creatinine, Ser: 1.09 mg/dL — ABNORMAL HIGH (ref 0.44–1.00)
GFR calc Af Amer: 49 mL/min — ABNORMAL LOW (ref >60)
GFR calc non Af Amer: 42 mL/min — ABNORMAL LOW (ref >60)
Glucose, Bld: 101 mg/dL — ABNORMAL HIGH (ref 65–99)
POTASSIUM: 3.2 mmol/L — AB (ref 3.5–5.1)
SODIUM: 141 mmol/L (ref 135–145)
TOTAL PROTEIN: 7.1 g/dL (ref 6.5–8.1)

## 2017-12-09 LAB — TROPONIN I: Troponin I: <0.03 ng/mL (ref <0.03)

## 2017-12-09 NOTE — ED Triage Notes (Signed)
Pt to triage via w/c with no distress noted; pt reports "heart beating fast" today accomp by nausea; denies pain; denies hx of same

## 2017-12-10 ENCOUNTER — Emergency Department
Admission: EM | Admit: 2017-12-10 | Discharge: 2017-12-10 | Disposition: A | Discharge status: Home / Self Care | Payer: Medicare Other | Attending: Emergency Medicine | Admitting: Emergency Medicine

## 2017-12-10 DIAGNOSIS — E876 Hypokalemia: Secondary | ICD-10-CM

## 2017-12-10 DIAGNOSIS — R627 Adult failure to thrive: Secondary | ICD-10-CM

## 2017-12-10 DIAGNOSIS — R002 Palpitations: Secondary | ICD-10-CM

## 2017-12-10 DIAGNOSIS — R630 Anorexia: Secondary | ICD-10-CM

## 2017-12-10 LAB — MAGNESIUM: Magnesium: 1.6 mg/dL — ABNORMAL LOW (ref 1.7–2.4)

## 2017-12-10 MED ORDER — POTASSIUM CHLORIDE CRYS ER 20 MEQ PO TBCR
40.0000 meq | EXTENDED_RELEASE_TABLET | Freq: Once | ORAL | Status: AC
  Administered 2017-12-10: 40 meq via ORAL
  Filled 2017-12-10: qty 2

## 2017-12-10 MED ORDER — POTASSIUM CHLORIDE CRYS ER 20 MEQ PO TBCR
EXTENDED_RELEASE_TABLET | ORAL | Status: AC
  Filled 2017-12-10: qty 1

## 2017-12-10 MED ORDER — MAGNESIUM OXIDE 400 (241.3 MG) MG PO TABS
400.0000 mg | ORAL_TABLET | ORAL | Status: AC
  Administered 2017-12-10: 400 mg via ORAL
  Filled 2017-12-10: qty 1

## 2017-12-10 MED ORDER — MAGNESIUM OXIDE 400 (241.3 MG) MG PO TABS: 400.0000 mg | ORAL_TABLET | Freq: Every day | ORAL | 2 refills | Status: DC

## 2017-12-10 MED ORDER — POTASSIUM CHLORIDE CRYS ER 20 MEQ PO TBCR
20.0000 meq | EXTENDED_RELEASE_TABLET | Freq: Every day | ORAL | 0 refills | Status: DC
Start: 2017-12-10 — End: 2021-04-30

## 2017-12-10 NOTE — ED Notes (Signed)
Pt to the ER for just not feeling good. Pt does not have an appetite and does not eat much. Pt

## 2017-12-10 NOTE — ED Provider Notes (Signed)
[facility] Emergency Department Provider Note  ____________________________________________   First MD Initiated Contact with Patient 12/10/17 0216     (approximate)  I have reviewed the triage vital signs and the nursing notes.   HISTORY  Chief Complaint Palpitations    HPI Kimberly Stokes is a 84 y.o. female who is generally healthy for her age and still lives on her own daughter who helps care for her.  She was experiencing some palpitations earlier for which she has seen her primary care doctor and were severe but have completely resolved.  The main issue about which the daughter is concerned is the patient's gradual weight loss over time and complete lack of appetite.  The daughter is encouraging her to drink boost or Ensure supplements but the patient is not interested in eating.  The daughter has spoken with her primary care provider and she explained there is not much to do about it other than encourage her to eat more, but the daughter continues to be concerned.  The patient has no complaints at this time, does agree that she had palpitations earlier which have resolved, is pleasant and smiling and agrees that she has no appetite and does not want to eat.  Daughter reports that the lack of appetite and malnourishment are severe and nothing is helping and they have been gradual over time.  The patient denies fever/chills, chest pain, shortness of breath, nausea, vomiting, and abdominal pain.  Past Medical History:  Diagnosis Date  . Arthritis   . Constipation   . Hypertension     There are no active problems to display for this patient.   Past Surgical History:  Procedure Laterality Date  . ABDOMINAL HYSTERECTOMY    . CATARACT EXTRACTION, BILATERAL      Prior to Admission medications   Medication Sig Start Date End Date Taking? Authorizing Provider  amLODipine (NORVASC) 10 MG tablet Take 10 mg by mouth daily.    [provider]    aspirin 81 MG chewable tablet Chew 81 mg by mouth daily.    [provider]  chlorthalidone (HYGROTON) 25 MG tablet Take 25 mg by mouth daily.    [provider]  Cholecalciferol (D3-1000) 1000 units capsule Take 1,000 Units by mouth daily.    [provider]  docusate sodium (COLACE) 100 MG capsule Take 2 capsules (200 mg total) by mouth 2 (two) times daily. Patient not taking: Reported on 04/22/2017 01/16/17   Sharman CheekStafford, Phillip, MD  famotidine (PEPCID) 20 MG tablet Take 1 tablet (20 mg total) by mouth 2 (two) times daily. Patient not taking: Reported on 04/22/2017 01/16/17   Sharman CheekStafford, Phillip, MD  magnesium oxide (MAG-OX) 400 (241.3 Mg) MG tablet Take 1 tablet (400 mg total) by mouth daily. 12/10/17   Loleta RoseForbach, Yarenis Cerino, MD  metoprolol succinate (TOPROL-XL) 50 MG 24 hr tablet Take 50 mg by mouth daily. Take with or immediately following a meal.    [provider]  polyethylene glycol (MIRALAX / GLYCOLAX) packet Take 17 g by mouth daily.    [provider]  potassium chloride SA (KLOR-CON M20) 20 MEQ tablet Take 1 tablet (20 mEq total) by mouth daily. 12/10/17   Loleta RoseForbach, Danyeal Akens, MD    Allergies Patient has no known allergies.  No family history on file.  Social History Social History   Tobacco Use  . Smoking status: Former Games developermoker  . Smokeless tobacco: Never Used  Substance Use Topics  . Alcohol use: No  .  Drug use: No    Review of Systems Constitutional: No fever/chills. Lack of appetite over long term Eyes: No visual changes. ENT: No sore throat. Cardiovascular: Denies chest pain. Palpitations, now resolved. Respiratory: Denies shortness of breath. Gastrointestinal: Significantly decreased oral intake. No abdominal pain.  No nausea, no vomiting.  No diarrhea.  No constipation. Genitourinary: Negative for dysuria. Musculoskeletal: Negative for neck pain.  Negative for back pain. Integumentary: Negative for rash. Neurological: Negative for  headaches, focal weakness or numbness.   ____________________________________________   PHYSICAL EXAM:  VITAL SIGNS: ED Triage Vitals  Enc Vitals Group     BP 12/09/17 2254 (!) 170/88     Pulse Rate 12/09/17 2254 67     Resp 12/09/17 2254 20     Temp 12/09/17 2254 97.8 F (36.6 C)     Temp src --      SpO2 12/09/17 2254 99 %     Weight 12/09/17 2251 48.1 kg (106 lb)     Height 12/09/17 2251 1.651 m (5\' 5" )     Head Circumference --      Peak Flow --      Pain Score --      Pain Loc --      Pain Edu? --      Excl. in GC? --     Constitutional: Alert and oriented. Elderly and appears malnourished but in no acute distress, pleasant and appropriately conversant Eyes: Conjunctivae are normal.  Head: Atraumatic. Nose: No congestion/rhinnorhea. Mouth/Throat: Mucous membranes are moist. Neck: No stridor.  No meningeal signs.   Cardiovascular: Normal rate, regular rhythm. Good peripheral circulation. Grossly normal heart sounds. Respiratory: Normal respiratory effort.  No retractions. Lungs CTAB. Gastrointestinal: Cachectic.  Soft and nontender. No distention.  Musculoskeletal: No lower extremity tenderness nor edema. No gross deformities of extremities. Neurologic:  Normal speech and language. No gross focal neurologic deficits are appreciated.  Skin:  Skin is warm, dry and intact. No rash noted. Psychiatric: Mood and affect are normal. Speech and behavior are normal.  ____________________________________________   LABS (all labs ordered are listed, but only abnormal results are displayed)  Labs Reviewed  COMPREHENSIVE METABOLIC PANEL - Abnormal; Notable for the following components:      Result Value   Potassium 3.2 (*)    Glucose, Bld 101 (*)    Creatinine, Ser 1.09 (*)    Calcium 11.4 (*)    ALT 9 (*)    GFR calc non Af Amer 42 (*)    GFR calc Af Amer 49 (*)    All other components within normal limits  MAGNESIUM - Abnormal; Notable for the following components:     Magnesium 1.6 (*)    All other components within normal limits  CBC  TROPONIN I   ____________________________________________  EKG  ED ECG REPORT I, Loleta Rose, the attending physician, personally viewed and interpreted this ECG.  Date: 12/09/2017 EKG Time: 22: 57 Rate: 61 Rhythm: normal sinus rhythm QRS Axis: normal Intervals: normal ST/T Wave abnormalities: Non-specific ST segment / T-wave changes, but no evidence of acute ischemia. Narrative Interpretation: no evidence of acute ischemia. No significant changes from ECG obtained about 6 months ago.   ____________________________________________  RADIOLOGY   No results found.  ____________________________________________   PROCEDURES  Critical Care performed: No   Procedure(s) performed:   Procedures   ____________________________________________   INITIAL IMPRESSION / ASSESSMENT AND PLAN / ED COURSE  As part of my medical decision making, I reviewed the following data within  the electronic MEDICAL RECORD NUMBER History obtained from family, Nursing notes reviewed and incorporated, EKG interpreted , Old EKG reviewed and Old chart reviewed    Differential diagnosis includes, but is not limited to, ACS, SVT, nonspecific cardiac arrhythmia, malnourishment leading to metabolic/electrolyte abnormalities, acute infection, etc.  Even the patient's age, her workup was generally reassuring and she is in no acute distress.  Her blood pressure is elevated but this may actually be reassuring given that based on her appearance I might expect hypotension.  Her BUN is normal and her creatinine is 1.09.  She is slightly hypomagnesemic and hypokalemic, and given the report of palpitations, I will treat both of these issues with oral supplements.  I had a long conversation with the patient and her daughter about how this is a chronic process and related to her age, explained that her body is simply shutting down in spite of her  relatively good health otherwise at her age and her continued independence.  I encouraged the patient and the daughter to allow the patient to eat whatever she wants, continue using dietary supplements, etc., and to follow-up with her primary care doctor.  I explained I am not familiar or comfortable prescribing medications to help with appetite but that their primary care doctor may be.  Daughter remains frustrated but seems to understand the situation.  I gave my usual customary return precautions.     ____________________________________________  FINAL CLINICAL IMPRESSION(S) / ED DIAGNOSES  Final diagnoses:  Palpitations  Decreased appetite  Failure to thrive in adult  Hypokalemia due to inadequate potassium intake  Hypomagnesemia     MEDICATIONS GIVEN DURING THIS VISIT:  Medications  potassium chloride SA (K-DUR,KLOR-CON) CR tablet 40 mEq (not administered)  potassium chloride SA (K-DUR,KLOR-CON) 20 MEQ CR tablet (not administered)  magnesium oxide (MAG-OX) tablet 400 mg (400 mg Oral Given 12/10/17 0241)     ED Discharge Orders        Ordered    potassium chloride SA (KLOR-CON M20) 20 MEQ tablet  Daily     12/10/17 0234    magnesium oxide (MAG-OX) 400 (241.3 Mg) MG tablet  Daily     12/10/17 0234       Note:  This document was prepared using Dragon voice recognition software and may include unintentional dictation errors.    Loleta Rose, MD 12/10/17 [phone]

## 2017-12-10 NOTE — Discharge Instructions (Signed)
As we discussed, your medical workup was generally reassuring tonight.  The main issue seems to be your decreased appetite that we believe is related to your age, which can be called "failure to thrive".  We talked about the importance of you eating what ever you are interested in, even if it seems unhealthy.  Please continue to drink Boost or Ensure drinks.  We have provided prescriptions for potassium and magnesium supplements and encourage you to follow-up with your regular doctor at the next available opportunity.  Please continue to drink plenty of fluids.  Return to the emergency department if you develop new or worsening symptoms that concern you.

## 2017-12-16 ENCOUNTER — Emergency Department
Admission: EM | Admit: 2017-12-16 | Discharge: 2017-12-16 | Disposition: A | Discharge status: Home / Self Care | Payer: Medicare Other | Source: Home / Self Care | Attending: Emergency Medicine | Admitting: Emergency Medicine

## 2017-12-16 ENCOUNTER — Encounter: Payer: Self-pay | Admitting: Emergency Medicine

## 2017-12-16 ENCOUNTER — Emergency Department: Payer: Medicare Other

## 2017-12-16 ENCOUNTER — Emergency Department
Admission: EM | Admit: 2017-12-16 | Discharge: 2017-12-16 | Disposition: A | Discharge status: Home / Self Care | Payer: Medicare Other | Attending: Emergency Medicine | Admitting: Emergency Medicine

## 2017-12-16 ENCOUNTER — Other Ambulatory Visit: Payer: Self-pay

## 2017-12-16 DIAGNOSIS — Z79899 Other long term (current) drug therapy: Secondary | ICD-10-CM

## 2017-12-16 DIAGNOSIS — Z87891 Personal history of nicotine dependence: Secondary | ICD-10-CM | POA: Diagnosis not present

## 2017-12-16 DIAGNOSIS — R627 Adult failure to thrive: Secondary | ICD-10-CM | POA: Insufficient documentation

## 2017-12-16 DIAGNOSIS — R079 Chest pain, unspecified: Secondary | ICD-10-CM

## 2017-12-16 DIAGNOSIS — R531 Weakness: Secondary | ICD-10-CM | POA: Diagnosis present

## 2017-12-16 DIAGNOSIS — I10 Essential (primary) hypertension: Secondary | ICD-10-CM | POA: Insufficient documentation

## 2017-12-16 DIAGNOSIS — Z7982 Long term (current) use of aspirin: Secondary | ICD-10-CM | POA: Insufficient documentation

## 2017-12-16 DIAGNOSIS — R5381 Other malaise: Secondary | ICD-10-CM | POA: Diagnosis not present

## 2017-12-16 LAB — CBC
HCT: 41.2 % (ref 35.0–47.0)
HEMATOCRIT: 41.7 % (ref 35.0–47.0)
Hemoglobin: 13.5 g/dL (ref 12.0–16.0)
Hemoglobin: 13.7 g/dL (ref 12.0–16.0)
MCH: 27.7 pg (ref 26.0–34.0)
MCH: 27.8 pg (ref 26.0–34.0)
MCHC: 32.7 g/dL (ref 32.0–36.0)
MCHC: 32.9 g/dL (ref 32.0–36.0)
MCV: 84.8 fL (ref 80.0–100.0)
MCV: 84.5 fL (ref 80.0–100.0)
PLATELETS: 313 10*3/uL (ref 150–440)
Platelets: 324 10*3/uL (ref 150–440)
RBC: 4.85 MIL/uL (ref 3.80–5.20)
RBC: 4.94 MIL/uL (ref 3.80–5.20)
RDW: 13.5 % (ref 11.5–14.5)
RDW: 13.7 % (ref 11.5–14.5)
WBC: 5.3 10*3/uL (ref 3.6–11.0)
WBC: 8.1 10*3/uL (ref 3.6–11.0)

## 2017-12-16 LAB — BASIC METABOLIC PANEL
Anion gap: 11 (ref 5–15)
Anion gap: 10 (ref 5–15)
BUN: 14 mg/dL (ref 6–20)
BUN: 16 mg/dL (ref 6–20)
CALCIUM: 12.4 mg/dL — AB (ref 8.9–10.3)
CHLORIDE: 95 mmol/L — AB (ref 101–111)
CO2: 28 mmol/L (ref 22–32)
CO2: 30 mmol/L (ref 22–32)
CREATININE: 0.99 mg/dL (ref 0.44–1.00)
Calcium: 12.9 mg/dL — ABNORMAL HIGH (ref 8.9–10.3)
Chloride: 96 mmol/L — ABNORMAL LOW (ref 101–111)
Creatinine, Ser: 1.08 mg/dL — ABNORMAL HIGH (ref 0.44–1.00)
GFR calc Af Amer: 49 mL/min — ABNORMAL LOW (ref >60)
GFR calc non Af Amer: 43 mL/min — ABNORMAL LOW (ref >60)
GFR, EST AFRICAN AMERICAN: 55 mL/min — AB (ref >60)
GFR, EST NON AFRICAN AMERICAN: 47 mL/min — AB (ref >60)
GLUCOSE: 128 mg/dL — AB (ref 65–99)
GLUCOSE: 153 mg/dL — AB (ref 65–99)
Potassium: 4.1 mmol/L (ref 3.5–5.1)
Potassium: 4.5 mmol/L (ref 3.5–5.1)
Sodium: 135 mmol/L (ref 135–145)
Sodium: 135 mmol/L (ref 135–145)

## 2017-12-16 LAB — URINALYSIS, COMPLETE (UACMP) WITH MICROSCOPIC
Bacteria, UA: NONE SEEN
Bilirubin Urine: NEGATIVE
GLUCOSE, UA: NEGATIVE mg/dL
HGB URINE DIPSTICK: NEGATIVE
Ketones, ur: 5 mg/dL — AB
Leukocytes, UA: NEGATIVE
Nitrite: NEGATIVE
Protein, ur: NEGATIVE mg/dL
SPECIFIC GRAVITY, URINE: 1.013 (ref 1.005–1.030)
Squamous Epithelial / LPF: NONE SEEN
pH: 7 (ref 5.0–8.0)

## 2017-12-16 LAB — TROPONIN I: Troponin I: <0.03 ng/mL (ref <0.03)

## 2017-12-16 NOTE — ED Triage Notes (Signed)
Pt presents to ED pov from home with her daughter with c/o generalized weakness since Thursday. Pt seen in this ED for the same and was sent home and told to follow up with her pcp. Seemed to be feeling better Friday but symptoms worsened again today. Pt not eating or drinking well at home.

## 2017-12-16 NOTE — Discharge Instructions (Signed)
As we discussed, there is no evidence of an acute or emergent medical issue tonight.  We talked last time about the fact that Kimberly Stokes has what is called "failure to thrive" and malnutrition as a result of her lack of appetite and age.  There is no emergent intervention we can provide to make her eat.  Her labs are reassuring today.  She does not need to keep taking the supplements prescribed last time because her labs look better tonight.  Please provide her with anything that she is willing to eat or drink and follow-up at the next available opportunity with her primary care doctor.  Return to the emergency department if you develop new or worsening symptoms that concern you.

## 2017-12-16 NOTE — ED Provider Notes (Signed)
[facility] Emergency Department Provider Note  Time seen: 8:00 AM  I have reviewed the triage vital signs and the nursing notes.   HISTORY  Chief Complaint Chest Pain    HPI Kimberly Stokes is a 84 y.o. female with a past medical history of arthritis, hypertension, high functioning, lives independently with family nearby presents to the emergency department for chest pain.  Patient was discharged approximately 2 hours ago from the emergency department and diagnosed with failure to thrive/malnutrition.  Patient admits she has not been eating or drinking much but that has been ongoing for several months per patient.  Patient states when she went home states she drank some orange juice and she developed lower chest pain/epigastric discomfort.  Had one episode of vomiting with complete resolution of her discomfort.  Patient had already called EMS so she came to the hospital for evaluation.  Family is here with the patient states she has not been eating or drinking very much over the past several months.  I discussed with the patient and family possibility of involving a Child psychotherapist to discuss possible in-home health or placement, they are not interested at this time.  Past Medical History:  Diagnosis Date  . Arthritis   . Constipation   . Hypertension     There are no active problems to display for this patient.   Past Surgical History:  Procedure Laterality Date  . ABDOMINAL HYSTERECTOMY    . CATARACT EXTRACTION, BILATERAL      Prior to Admission medications   Medication Sig Start Date End Date Taking? Authorizing Provider  amLODipine (NORVASC) 10 MG tablet Take 10 mg by mouth daily.    [provider]  aspirin 81 MG chewable tablet Chew 81 mg by mouth daily.    [provider]  chlorthalidone (HYGROTON) 25 MG tablet Take 25 mg by mouth daily.    [provider]  Cholecalciferol (D3-1000) 1000 units capsule Take 1,000 Units by  mouth daily.    [provider]  docusate sodium (COLACE) 100 MG capsule Take 2 capsules (200 mg total) by mouth 2 (two) times daily. Patient not taking: Reported on 04/22/2017 01/16/17   Sharman Cheek, MD  famotidine (PEPCID) 20 MG tablet Take 1 tablet (20 mg total) by mouth 2 (two) times daily. Patient not taking: Reported on 04/22/2017 01/16/17   Sharman Cheek, MD  magnesium oxide (MAG-OX) 400 (241.3 Mg) MG tablet Take 1 tablet (400 mg total) by mouth daily. 12/10/17   Loleta Rose, MD  metoprolol succinate (TOPROL-XL) 50 MG 24 hr tablet Take 50 mg by mouth daily. Take with or immediately following a meal.    [provider]  polyethylene glycol (MIRALAX / GLYCOLAX) packet Take 17 g by mouth daily.    [provider]  potassium chloride SA (KLOR-CON M20) 20 MEQ tablet Take 1 tablet (20 mEq total) by mouth daily. 12/10/17   Loleta Rose, MD    No Known Allergies  History reviewed. No pertinent family history.  Social History Social History   Tobacco Use  . Smoking status: Former Games developer  . Smokeless tobacco: Never Used  Substance Use Topics  . Alcohol use: No  . Drug use: No    Review of Systems Constitutional: Negative for fever. Eyes: Negative for visual complaints ENT: Negative for congestion Cardiovascular: Mild lower chest/epigastric discomfort, resolved after one episode of vomiting Respiratory: Negative for shortness of breath. Gastrointestinal: Mild lower chest/epigastric discomfort resolved after one episode of vomiting.  Denies any current nausea.  Denies diarrhea. Genitourinary: Negative for urinary compaints Musculoskeletal: Negative for musculoskeletal complaints Skin: Negative for skin complaints  Neurological: Negative for headache All other ROS negative  ____________________________________________   PHYSICAL EXAM:  VITAL SIGNS: ED Triage Vitals  Enc Vitals Group     BP 12/16/17 0747 (!) 181/84     Pulse Rate 12/16/17  0747 69     Resp 12/16/17 0747 20     Temp 12/16/17 0747 97.9 F (36.6 C)     Temp Source 12/16/17 0747 Oral     SpO2 12/16/17 0747 100 %     Weight 12/16/17 0748 106 lb (48.1 kg)     Height 12/16/17 0748 5' (1.524 m)     Head Circumference --      Peak Flow --      Pain Score 12/16/17 0746 8     Pain Loc --      Pain Edu? --      Excl. in GC? --    Constitutional: Alert and oriented. Well appearing and in no distress. Eyes: Normal exam ENT   Head: Normocephalic and atraumatic.   Mouth/Throat: Mucous membranes are moist. Cardiovascular: Normal rate, regular rhythm.  Respiratory: Normal respiratory effort without tachypnea nor retractions. Breath sounds are clear Gastrointestinal: Soft and nontender. No distention.   Musculoskeletal: Nontender with normal range of motion in all extremities. No lower extremity tenderness or edema. Neurologic:  Normal speech and language. No gross focal neurologic deficits Skin:  Skin is warm, dry and intact.  Psychiatric: Mood and affect are normal.   ____________________________________________    EKG  EKG reviewed and interpreted by myself shows normal sinus rhythm at 75 bpm with a narrow QRS, normal axis, largely normal intervals, nonspecific ST changes.  EKG unchanged from 12/09/17.  ____________________________________________    RADIOLOGY  X-ray negative  ____________________________________________   INITIAL IMPRESSION / ASSESSMENT AND PLAN / ED COURSE  Pertinent labs & imaging results that were available during my care of the patient were reviewed by me and considered in my medical decision making (see chart for details).  Patient presents to the emergency department for chest pain/epigastric discomfort that resolved after one episode of vomiting.  Patient denies any symptoms at this time.  Denies any current nausea, chest pain or abdominal pain.  Differential would include indigestion, gastroenteritis, emesis, less  likely ACS.  We will check labs, EKG, x-ray and continue to closely monitor.  Currently the patient appears very well.  Patient was just discharged from the hospital approximately 2 hours ago after being evaluated for failure to thrive.  I discussed with the family and the patient obtaining a social worker consult for possible in-home health care versus nursing home placement for the patient.  Patient states she feels safe at home, states she can still care for herself is not interested in speaking to a Child psychotherapistsocial worker at this time.  X-rays negative, EKG unchanged from prior.  Labs including cardiac enzymes are normal.  Patient denies any symptoms at this time is asking to be discharged from the emergency department.  Patient and family not interested in speaking to social worker at this time, patient believes she can still care for herself safely at home by herself.  I discussed my normal chest pain return precautions with the patient, she is agreeable to this plan of care.  ____________________________________________   FINAL CLINICAL IMPRESSION(S) / ED DIAGNOSES  Chest pain    Minna AntisPaduchowski, Barnard Sharps, MD 12/16/17 [phone]

## 2017-12-16 NOTE — ED Provider Notes (Signed)
[facility] Emergency Department Provider Note  ____________________________________________   First MD Initiated Contact with Patient 12/16/17 [phone]     (approximate)  I have reviewed the triage vital signs and the nursing notes.   HISTORY  Chief Complaint Weakness    HPI Kimberly Stokes is a 83 y.o. female who lives independently with close assistance of her daughter and the rest of her family.  They present by private vehicle tonight for evaluation of continuing to not have an appetite and generally feeling "bad".  I saw this patient and her daughter several days ago and she had a reassuring workup.  We discussed failure to thrive in the geriatric patient and how the patient needs to try to eat whatever she is willing to eat and take nutritional supplements.  I explained that her body is wearing down and even though she continues to function surprisingly independently, she is clearly malnourished and there is little to do in the acute setting.  I encouraged outpatient follow-up with her primary care doctor.  Her daughter reports that they have not been back to the primary care doctor but she is concerned about the patient's appetite and overall status.  The patient expresses no concerns.  She acknowledges that she is not eating.  She denies any pain or vomiting.  She states that she eats "when I feel like it".  She denies any recent illnesses including fever/chills and she has had no shortness of breath.  She occasionally has some aching in her chest.  Her daughter expresses concerns about her weakness but the patient is getting up and walking around by herself and not requiring any assistance.  She has not had any numbness or weakness in any of her extremities.   Past Medical History:  Diagnosis Date  . Arthritis   . Constipation   . Hypertension     There are no active problems to display for this patient.   Past Surgical History:  Procedure Laterality  Date  . ABDOMINAL HYSTERECTOMY    . CATARACT EXTRACTION, BILATERAL      Prior to Admission medications   Medication Sig Start Date End Date Taking? Authorizing Provider  amLODipine (NORVASC) 10 MG tablet Take 10 mg by mouth daily.    [provider]  aspirin 81 MG chewable tablet Chew 81 mg by mouth daily.    [provider]  chlorthalidone (HYGROTON) 25 MG tablet Take 25 mg by mouth daily.    [provider]  Cholecalciferol (D3-1000) 1000 units capsule Take 1,000 Units by mouth daily.    [provider]  docusate sodium (COLACE) 100 MG capsule Take 2 capsules (200 mg total) by mouth 2 (two) times daily. Patient not taking: Reported on 04/22/2017 01/16/17   Sharman Cheek, MD  famotidine (PEPCID) 20 MG tablet Take 1 tablet (20 mg total) by mouth 2 (two) times daily. Patient not taking: Reported on 04/22/2017 01/16/17   Sharman Cheek, MD  magnesium oxide (MAG-OX) 400 (241.3 Mg) MG tablet Take 1 tablet (400 mg total) by mouth daily. 12/10/17   Loleta Rose, MD  metoprolol succinate (TOPROL-XL) 50 MG 24 hr tablet Take 50 mg by mouth daily. Take with or immediately following a meal.    [provider]  polyethylene glycol (MIRALAX / GLYCOLAX) packet Take 17 g by mouth daily.    [provider]  potassium chloride SA (KLOR-CON M20) 20 MEQ tablet Take 1 tablet (20 mEq total) by mouth daily. 12/10/17  Loleta RoseForbach, Esbeydi Manago, MD    Allergies Patient has no known allergies.  History reviewed. No pertinent family history.  Social History Social History   Tobacco Use  . Smoking status: Former Games developermoker  . Smokeless tobacco: Never Used  Substance Use Topics  . Alcohol use: No  . Drug use: No    Review of Systems Constitutional: Decreased appetite.  Failure to thrive. No fever/chills Eyes: No visual changes. ENT: No sore throat. Cardiovascular: Occasional aching in chest Respiratory: Denies shortness of breath. Gastrointestinal: No appetite.  No abdominal pain.  No nausea, no vomiting.  No diarrhea.  No constipation. Genitourinary: Negative for dysuria. Musculoskeletal: Negative for neck pain.  Negative for back pain. Integumentary: Negative for rash. Neurological: Negative for headaches, focal weakness or numbness.   ____________________________________________   PHYSICAL EXAM:  VITAL SIGNS: ED Triage Vitals  Enc Vitals Group     BP 12/16/17 0303 135/74     Pulse Rate 12/16/17 0303 76     Resp 12/16/17 0303 18     Temp 12/16/17 0303 98.3 F (36.8 C)     Temp Source 12/16/17 0303 Oral     SpO2 12/16/17 0303 100 %     Weight 12/16/17 0304 48.1 kg (106 lb)     Height 12/16/17 0304 1.524 m (5')     Head Circumference --      Peak Flow --      Pain Score 12/16/17 0303 0     Pain Loc --      Pain Edu? --      Excl. in GC? --     Constitutional: Alert and oriented.  Elderly and malnourished but in no acute distress Eyes: Conjunctivae are normal.  Head: Atraumatic. Nose: No congestion/rhinnorhea. Mouth/Throat: Mucous membranes are moist. Neck: No stridor.  No meningeal signs.   Cardiovascular: Normal rate, regular rhythm. Good peripheral circulation. Grossly normal heart sounds. Respiratory: Normal respiratory effort.  No retractions. Lungs CTAB. Gastrointestinal: Cachectic. soft and nontender. No distention.  Musculoskeletal: No lower extremity tenderness nor edema. No gross deformities of extremities. Neurologic:  Normal speech and language. No gross focal neurologic deficits are appreciated.  Skin:  Skin is warm, dry and intact. No rash noted. Psychiatric: Mood and affect are normal. Speech and behavior are normal.  ____________________________________________   LABS (all labs ordered are listed, but only abnormal results are displayed)  Labs Reviewed  BASIC METABOLIC PANEL - Abnormal; Notable for the following components:      Result Value   Chloride 96 (*)    Glucose, Bld 128 (*)    Calcium 12.4 (*)      GFR calc non Af Amer 47 (*)    GFR calc Af Amer 55 (*)    All other components within normal limits  URINALYSIS, COMPLETE (UACMP) WITH MICROSCOPIC - Abnormal; Notable for the following components:   Color, Urine YELLOW (*)    APPearance CLOUDY (*)    Ketones, ur 5 (*)    All other components within normal limits  CBC   ____________________________________________  EKG  ED ECG REPORT I, Loleta Roseory Jesyka Slaght, the attending physician, personally viewed and interpreted this ECG.  Date: 12/16/2017 EKG Time: 03:05 Rate: 77 Rhythm: normal sinus rhythm QRS Axis: normal Intervals: normal ST/T Wave abnormalities: Non-specific ST segment / T-wave changes, but no evidence of acute ischemia. Narrative Interpretation: no evidence of acute ischemia.  No significant changes from prior   ____________________________________________  RADIOLOGY   Dg Chest 2 View  Result Date: 12/16/2017 CLINICAL  DATA:  Patient with centralized chest pain for 2 weeks. EXAM: CHEST  2 VIEW COMPARISON:  Chest radiograph 04/22/2017. FINDINGS: Normal cardiac and mediastinal contours. Tortuosity of the thoracic aorta. No consolidative pulmonary opacities. No pleural effusion or pneumothorax. Thoracic spine degenerative changes. IMPRESSION: No acute cardiopulmonary process. Electronically Signed   By: Annia Belt M.D.   On: 12/16/2017 08:46    ____________________________________________   PROCEDURES  Critical Care performed: No   Procedure(s) performed:   Procedures   ____________________________________________   INITIAL IMPRESSION / ASSESSMENT AND PLAN / ED COURSE  As part of my medical decision making, I reviewed the following data within the electronic MEDICAL RECORD NUMBER History obtained from family, Nursing notes reviewed and incorporated, Labs reviewed , EKG interpreted , Old chart reviewed and Notes from prior ED visits    Differential diagnosis includes, but is not limited to, failure to  thrive/malnutrition, metabolic abnormality, ACS, CVA, acute infection such as UTI.  The patient has no respiratory symptoms.  She has no complaints at this time.  Her potassium has improved from prior and I told her she does not need to continue taking her pills.  I once again encouraged the patient and her family to allow her to eat whatever she wants and I heartily encouraged the patient to eat what ever and whenever she can.  She acknowledges that she should do so.  I spoke with the patient's great grandson in person as well as the patient's daughter and I explained that there is no evidence of any addition at that they do need to follow-up with the primary care doctor but there is no acute fix to failure to thrive and and unless they want to have a feeding tube placed as an outpatient (they patient shook her head "no" to this), they cannot force her to eat.  The patient acknowledges our conversation and understands and agrees with the plan for outpatient follow-up.  I gave my usual and customary return precautions.     ____________________________________________  FINAL CLINICAL IMPRESSION(S) / ED DIAGNOSES  Final diagnoses:  Adult failure to thrive     MEDICATIONS GIVEN DURING THIS VISIT:  Medications - No data to display   ED Discharge Orders    None       Note:  This document was prepared using Dragon voice recognition software and may include unintentional dictation errors.    Loleta Rose, MD 12/16/17 [phone]

## 2017-12-16 NOTE — ED Triage Notes (Signed)
Pt presents to ED via [facility]. Per EMS pt was D/C approx 1.5hrs ago with dx of failure to thrive and malnutrition. Pt returns to ED with c/o chest pain with sudden onset that relieved itself en route without medications. Per previous triage note pt not eating or drinking well at home, EMS reports pt is only able to drink ensure at home due to lack of appetite.

## 2017-12-16 NOTE — ED Notes (Signed)
NAD noted at time of D/C. Pt and family denies questions or concerns. Pt taken to the lobby via wheelchair at this time.

## 2017-12-16 NOTE — Discharge Instructions (Signed)
You have been seen in the emergency department today for chest pain. Your workup has shown normal results. As we discussed please follow-up with your primary care physician in the next 1-2 days for recheck. Return to the emergency department for any further chest pain, trouble breathing, or any other symptom personally concerning to yourself. °

## 2017-12-16 NOTE — ED Notes (Signed)
This RN to bedside to apologize for and explain delay to patient and family. Pt is noted to be resting comfortably in bed at this time. No change in patient condition. Will continue to monitor for further patient needs.

## 2017-12-16 NOTE — ED Notes (Signed)
Pt states she has been feeling nauseous and gagging at time unable to actually vomit. Pt states she is having abd pain x 2 weeks, but denies any increase of pain with palpation. Pt has moderate strength in all extremities and states she is able to walk without an assistive device.

## 2018-06-27 ENCOUNTER — Other Ambulatory Visit: Payer: Self-pay

## 2018-06-27 ENCOUNTER — Emergency Department: Payer: Medicare Other

## 2018-06-27 ENCOUNTER — Encounter: Payer: Self-pay | Admitting: Emergency Medicine

## 2018-06-27 ENCOUNTER — Emergency Department
Admission: EM | Admit: 2018-06-27 | Discharge: 2018-06-27 | Disposition: A | Discharge status: Home / Self Care | Payer: Medicare Other | Attending: Emergency Medicine | Admitting: Emergency Medicine

## 2018-06-27 DIAGNOSIS — I10 Essential (primary) hypertension: Secondary | ICD-10-CM | POA: Diagnosis not present

## 2018-06-27 DIAGNOSIS — K8020 Calculus of gallbladder without cholecystitis without obstruction: Secondary | ICD-10-CM

## 2018-06-27 DIAGNOSIS — Z79899 Other long term (current) drug therapy: Secondary | ICD-10-CM | POA: Diagnosis not present

## 2018-06-27 DIAGNOSIS — Z7982 Long term (current) use of aspirin: Secondary | ICD-10-CM | POA: Diagnosis not present

## 2018-06-27 DIAGNOSIS — R1031 Right lower quadrant pain: Secondary | ICD-10-CM | POA: Diagnosis present

## 2018-06-27 DIAGNOSIS — K5900 Constipation, unspecified: Secondary | ICD-10-CM | POA: Diagnosis not present

## 2018-06-27 LAB — CBC
HCT: 35.9 % (ref 35.0–47.0)
HEMOGLOBIN: 11.8 g/dL — AB (ref 12.0–16.0)
MCH: 28.5 pg (ref 26.0–34.0)
MCHC: 32.9 g/dL (ref 32.0–36.0)
MCV: 86.7 fL (ref 80.0–100.0)
Platelets: 226 10*3/uL (ref 150–440)
RBC: 4.14 MIL/uL (ref 3.80–5.20)
RDW: 14.4 % (ref 11.5–14.5)
WBC: 4.9 10*3/uL (ref 3.6–11.0)

## 2018-06-27 LAB — URINALYSIS, COMPLETE (UACMP) WITH MICROSCOPIC
BILIRUBIN URINE: NEGATIVE
Bacteria, UA: NONE SEEN
Glucose, UA: NEGATIVE mg/dL
Ketones, ur: NEGATIVE mg/dL
Leukocytes, UA: NEGATIVE
NITRITE: NEGATIVE
PH: 5 (ref 5.0–8.0)
Protein, ur: NEGATIVE mg/dL
SPECIFIC GRAVITY, URINE: 1.021 (ref 1.005–1.030)

## 2018-06-27 LAB — COMPREHENSIVE METABOLIC PANEL
ALT: 7 U/L (ref 0–44)
ANION GAP: 8 (ref 5–15)
AST: 21 U/L (ref 15–41)
Albumin: 4.2 g/dL (ref 3.5–5.0)
Alkaline Phosphatase: 42 U/L (ref 38–126)
BUN: 28 mg/dL — ABNORMAL HIGH (ref 8–23)
CALCIUM: 11 mg/dL — AB (ref 8.9–10.3)
CO2: 28 mmol/L (ref 22–32)
Chloride: 104 mmol/L (ref 98–111)
Creatinine, Ser: 1.12 mg/dL — ABNORMAL HIGH (ref 0.44–1.00)
GFR calc Af Amer: 47 mL/min — ABNORMAL LOW (ref >60)
GFR, EST NON AFRICAN AMERICAN: 41 mL/min — AB (ref >60)
GLUCOSE: 100 mg/dL — AB (ref 70–99)
Potassium: 4 mmol/L (ref 3.5–5.1)
Sodium: 140 mmol/L (ref 135–145)
Total Bilirubin: 0.5 mg/dL (ref 0.3–1.2)
Total Protein: 6.9 g/dL (ref 6.5–8.1)

## 2018-06-27 LAB — LIPASE, BLOOD: LIPASE: 39 U/L (ref 11–51)

## 2018-06-27 NOTE — ED Triage Notes (Signed)
Lower R abdominal pain x 2 weeks. Denies fevers. Denies dysuria.

## 2018-06-27 NOTE — ED Notes (Signed)

## 2018-06-27 NOTE — ED Provider Notes (Signed)
[facility] Emergency Department Provider Note  ____________________________________________  Time seen: Approximately 6:28 PM  I have reviewed the triage vital signs and the nursing notes.   HISTORY  Chief Complaint Abdominal Pain   HPI Kimberly Stokes is a 84 y.o. female with a history of constipation, cholelithiasis, hypertension and arthritis who presents for evaluation of abdominal pain.  Patient reports intermittent episodes of right lower quadrant abdominal pain.  She describes the pain as sudden, sharp, located in the right lower quadrant, constant for 30 minutes and nonradiating.  The pain resolved without intervention.  The pain is not worse postprandially.  This morning she had a more severe episode which prompted her visit to the emergency room.  At this time she has no pain.  No nausea, vomiting, fever, chills, diarrhea, constipation, dysuria, hematuria, chest pain or shortness of breath.  Past Medical History:  Diagnosis Date  . Arthritis   . Constipation   . Hypertension     Past Surgical History:  Procedure Laterality Date  . ABDOMINAL HYSTERECTOMY    . CATARACT EXTRACTION, BILATERAL      Prior to Admission medications   Medication Sig Start Date End Date Taking? Authorizing Provider  amLODipine (NORVASC) 10 MG tablet Take 10 mg by mouth daily.    [provider]  aspirin 81 MG chewable tablet Chew 81 mg by mouth daily.    [provider]  chlorthalidone (HYGROTON) 25 MG tablet Take 25 mg by mouth daily.    [provider]  Cholecalciferol (D3-1000) 1000 units capsule Take 1,000 Units by mouth daily.    [provider]  docusate sodium (COLACE) 100 MG capsule Take 2 capsules (200 mg total) by mouth 2 (two) times daily. 01/16/17   Sharman Cheek, MD  famotidine (PEPCID) 20 MG tablet Take 1 tablet (20 mg total) by mouth 2 (two) times daily. 01/16/17   Sharman Cheek, MD  magnesium oxide (MAG-OX) 400  (241.3 Mg) MG tablet Take 1 tablet (400 mg total) by mouth daily. Patient not taking: Reported on 12/16/2017 12/10/17   Loleta Rose, MD  metoprolol succinate (TOPROL-XL) 50 MG 24 hr tablet Take 50 mg by mouth daily. Take with or immediately following a meal.    [provider]  polyethylene glycol (MIRALAX / GLYCOLAX) packet Take 17 g by mouth daily.    [provider]  potassium chloride SA (KLOR-CON M20) 20 MEQ tablet Take 1 tablet (20 mEq total) by mouth daily. Patient not taking: Reported on 12/16/2017 12/10/17   Loleta Rose, MD    Allergies Patient has no known allergies.  No family history on file.  Social History Social History   Tobacco Use  . Smoking status: Former Games developer  . Smokeless tobacco: Never Used  Substance Use Topics  . Alcohol use: No  . Drug use: No    Review of Systems  Constitutional: Negative for fever. Eyes: Negative for visual changes. ENT: Negative for sore throat. Neck: No neck pain  Cardiovascular: Negative for chest pain. Respiratory: Negative for shortness of breath. Gastrointestinal: + RLQ abdominal pain. No vomiting or diarrhea. Genitourinary: Negative for dysuria. Musculoskeletal: Negative for back pain. Skin: Negative for rash. Neurological: Negative for headaches, weakness or numbness. Psych: No SI or HI  ____________________________________________   PHYSICAL EXAM:  VITAL SIGNS: ED Triage Vitals  Enc Vitals Group     BP 06/27/18 1609 (!) 165/76     Pulse Rate 06/27/18 1609 63     Resp 06/27/18 1609  18     Temp 06/27/18 1609 98.6 F (37 C)     Temp Source 06/27/18 1609 Oral     SpO2 06/27/18 1609 100 %     Weight 06/27/18 1610 108 lb (49 kg)     Height 06/27/18 1610 5\' 2"  (1.575 m)     Head Circumference --      Peak Flow --      Pain Score 06/27/18 1610 8     Pain Loc --      Pain Edu? --      Excl. in GC? --     Constitutional: Alert and oriented. Well appearing and in no apparent  distress. HEENT:      Head: Normocephalic and atraumatic.         Eyes: Conjunctivae are normal. Sclera is non-icteric.       Mouth/Throat: Mucous membranes are moist.       Neck: Supple with no signs of meningismus. Cardiovascular: Regular rate and rhythm. No murmurs, gallops, or rubs. 2+ symmetrical distal pulses are present in all extremities. No JVD. Respiratory: Normal respiratory effort. Lungs are clear to auscultation bilaterally. No wheezes, crackles, or rhonchi.  Gastrointestinal: Soft, non tender, and non distended with positive bowel sounds. No rebound or guarding. Genitourinary: No CVA tenderness. Musculoskeletal: Nontender with normal range of motion in all extremities. No edema, cyanosis, or erythema of extremities. Neurologic: Normal speech and language. Face is symmetric. Moving all extremities. No gross focal neurologic deficits are appreciated. Skin: Skin is warm, dry and intact. No rash noted. Psychiatric: Mood and affect are normal. Speech and behavior are normal.  ____________________________________________   LABS (all labs ordered are listed, but only abnormal results are displayed)  Labs Reviewed  COMPREHENSIVE METABOLIC PANEL - Abnormal; Notable for the following components:      Result Value   Glucose, Bld 100 (*)    BUN 28 (*)    Creatinine, Ser 1.12 (*)    Calcium 11.0 (*)    GFR calc non Af Amer 41 (*)    GFR calc Af Amer 47 (*)    All other components within normal limits  CBC - Abnormal; Notable for the following components:   Hemoglobin 11.8 (*)    All other components within normal limits  URINALYSIS, COMPLETE (UACMP) WITH MICROSCOPIC - Abnormal; Notable for the following components:   Color, Urine YELLOW (*)    APPearance CLEAR (*)    Hgb urine dipstick SMALL (*)    All other components within normal limits  LIPASE, BLOOD    ____________________________________________  EKG  none ____________________________________________  RADIOLOGY  I have personally reviewed the images performed during this visit and I agree with the Radiologist's read.   Interpretation by Radiologist:  Ct Abdomen Pelvis Wo Contrast  Result Date: 06/27/2018 CLINICAL DATA:  84 year old female with lower right abdominal pain for 2 weeks. Prior hysterectomy. Initial encounter. EXAM: CT ABDOMEN AND PELVIS WITHOUT CONTRAST TECHNIQUE: Multidetector CT imaging of the abdomen and pelvis was performed following the standard protocol without IV contrast. COMPARISON:  01/16/2017 CT. FINDINGS: Lower chest: Minimal basilar atelectasis/scarring. Heart slightly enlarged. Coronary artery calcification. Hepatobiliary: Taking into account limitation by non contrast imaging, no worrisome hepatic lesion. Multiple calcified gallstones. Gallbladder is prominent. No CT evidence of pericholecystic inflammation. If this were of concern ultrasound may be considered. Pancreas: Taking into account limitation by non contrast imaging, no worrisome pancreatic lesion. Spleen: Taking into account limitation by non contrast imaging, no splenic mass or enlargement. Adrenals/Urinary  Tract: No obstructing stone or hydronephrosis. Taking into account limitation by non contrast imaging, no worrisome renal lesion. Mild adrenal gland hyperplasia. Partially contracted urinary bladder without gross abnormality. Stomach/Bowel: No extraluminal bowel inflammatory process. Gas and stool distended rectosigmoid region. Gas-filled normal/top-normal size loops of colon and small bowel otherwise noted. Bowel extends toward (but does not enter) right inguinal canal. Narrowing gastric body may be related to peristalsis. Vascular/Lymphatic: Atherosclerotic changes abdominal aorta and common iliac arteries and aortic branch vessels. Bulge of the lower abdominal aorta with maximal transverse dimension  of 2.3 cm (previously 2.2 cm). 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 Scattered small lymph nodes without adenopathy. Reproductive: Post hysterectomy.  No worrisome adnexal mass. Other: No free air. Fat and vessel containing right inguinal hernia. Musculoskeletal: No acute osseous abnormality. Hip joint degenerative changes and mild degenerative changes lower thoracic and lumbar spine. IMPRESSION: 1. No extraluminal bowel inflammatory process. Gas and stool distended rectosigmoid region. Gas-filled normal/top-normal size loops of colon and small bowel otherwise noted. Bowel extends toward (but does not enter) right inguinal canal. 2. Narrowing gastric body may be related to peristalsis. 3. Multiple gallstones with prominent size gallbladder without CT evidence of inflammation however if this were of concern ultrasound may be considered. 4. Aortic Atherosclerosis (ICD10-I70.0). Bulge lower abdominal aorta now measures 2.3 cm versus prior 2.2 cm. Atherosclerotic changes common iliac arteries without significant change. Electronically Signed   By: Lacy Duverney M.D.   On: 06/27/2018 18:09    ____________________________________________   PROCEDURES  Procedure(s) performed: None Procedures Critical Care performed:  None ____________________________________________   INITIAL IMPRESSION / ASSESSMENT AND PLAN / ED COURSE  84 y.o. female with a history of constipation, cholelithiasis, hypertension and arthritis who presents for evaluation of intermittent RLQ abdominal pain.  Patient has no pain at this time, she is extremely well-appearing, no distress, has normal vital signs, abdomen is soft and nonobese, there is no tenderness to palpation, no masses are palpable, no pulsatile mass.  Labs including CBC, CMP, lipase with no acute findings.  UA negative for UTI showing small amount of blood.  CT was done which  showed cholelithiasis with no evidence of cholecystitis.  Clinically there are no signs or symptoms of cholecystitis.  CT also showing significant gas and stool in the intestine concerning for possible constipation.  Patient be discharged home on senna and Colace and increase p.o. hydration.  Discussed return precautions for new or worsening abdominal pain, nausea, vomiting, fever.  Otherwise she will follow-up with her primary care doctor peer      As part of my medical decision making, I reviewed the following data within the electronic MEDICAL RECORD NUMBER History obtained from family, Nursing notes reviewed and incorporated, Labs reviewed , Old chart reviewed, Radiograph reviewed , Notes from prior ED visits and [place] Controlled Substance Database    Pertinent labs & imaging results that were available during my care of the patient were reviewed by me and considered in my medical decision making (see chart for details).    ____________________________________________   FINAL CLINICAL IMPRESSION(S) / ED DIAGNOSES  Final diagnoses:  Calculus of gallbladder without cholecystitis without obstruction  Constipation, unspecified constipation type  Right lower quadrant abdominal pain      NEW MEDICATIONS STARTED DURING THIS VISIT:  ED Discharge Orders    None       Note:  This document was prepared using Dragon  voice recognition software and may include unintentional dictation errors.    Don PerkingVeronese, [place], MD 06/27/18 [phone]

## 2018-06-27 NOTE — Discharge Instructions (Addendum)
You have been seen in the Emergency Department (ED) for abdominal pain.  Your evaluation did not identify a clear cause of your symptoms but was generally reassuring.  Abdominal pain has many possible causes. Some aren't serious and get better on their own in a few days. Others need more testing and treatment. If your pain continues or gets worse, you need to be rechecked and may need more tests to find out what is wrong. You may need surgery to correct the problem.   Follow up with your doctor in 12-24 hours if you are still having abdominal pain. Otherwise follow up in 1-3 days for a re-check  Your CT scan showed stones in your gallbladder and a lot of stool in the intestine.   For constipation: Take colace twice a day everyday. Take senna once a day at bedtime. Take daily probiotics. Drink plenty of fluids and eat a diet rich in fiber. If you go more than 3 days without a bowel movement, take 1 cap full of Miralax in the morning and one in the evening up to 5 days.    Don't ignore new symptoms, such as fever, nausea and vomiting, new or worsening abdominal pain, urination problems, bloody diarrhea or bloody stools, black tarry stools, uncontrollable nausea and vomiting, and dizziness. These may be signs of a more serious problem. If you develop any of these you should be seen by your doctor immediately or return to the ED.   How can you care for yourself at home?  Rest until you feel better.  To prevent dehydration, drink plenty of fluids, enough so that your urine is light yellow or clear like water. Choose water and other caffeine-free clear liquids until you feel better. If you have kidney, heart, or liver disease and have to limit fluids, talk with your doctor before you increase the amount of fluids you drink.  If your stomach is upset, eat mild foods, such as rice, dry toast or crackers, bananas, and applesauce. Try eating several small meals instead of two or three large ones.  Wait until  48 hours after all symptoms have gone away before you have spicy foods, alcohol, and drinks that contain caffeine.  Do not eat foods that are high in fat.  Avoid anti-inflammatory medicines such as aspirin, ibuprofen (Advil, Motrin), and naproxen (Aleve). These can cause stomach upset. Talk to your doctor if you take daily aspirin for another health problem.  When should you call for help?  Call 911 anytime you think you may need emergency care. For example, call if:  You passed out (lost consciousness).  You pass maroon or very bloody stools.  You vomit blood or what looks like coffee grounds.  You have new, severe belly pain.  Call your doctor now or seek immediate medical care if:  Your pain gets worse, especially if it becomes focused in one area of your belly.  You have a new or higher fever.  Your stools are black and look like tar, or they have streaks of blood.  You have unexpected vaginal bleeding.  You have symptoms of a urinary tract infection. These may include:  Pain when you urinate.  Urinating more often than usual.  Blood in your urine. You are dizzy or lightheaded, or you feel like you may faint. Watch closely for changes in your health, and be sure to contact your doctor if:  You are not getting better after 1 day (24 hours).

## 2021-04-30 ENCOUNTER — Inpatient Hospital Stay
Admission: EM | Admit: 2021-04-30 | Discharge: 2021-05-06 | DRG: 481 | Disposition: A | Discharge status: Skilled Nursing Facility | Payer: Medicare Other | Attending: Internal Medicine | Admitting: Internal Medicine

## 2021-04-30 ENCOUNTER — Emergency Department: Payer: Medicare Other

## 2021-04-30 ENCOUNTER — Other Ambulatory Visit: Payer: Self-pay

## 2021-04-30 DIAGNOSIS — E876 Hypokalemia: Secondary | ICD-10-CM | POA: Diagnosis present

## 2021-04-30 DIAGNOSIS — R4189 Other symptoms and signs involving cognitive functions and awareness: Secondary | ICD-10-CM | POA: Diagnosis present

## 2021-04-30 DIAGNOSIS — D62 Acute posthemorrhagic anemia: Secondary | ICD-10-CM | POA: Diagnosis not present

## 2021-04-30 DIAGNOSIS — S72102K Unspecified trochanteric fracture of left femur, subsequent encounter for closed fracture with nonunion: Secondary | ICD-10-CM

## 2021-04-30 DIAGNOSIS — R739 Hyperglycemia, unspecified: Secondary | ICD-10-CM | POA: Diagnosis present

## 2021-04-30 DIAGNOSIS — D696 Thrombocytopenia, unspecified: Secondary | ICD-10-CM | POA: Diagnosis present

## 2021-04-30 DIAGNOSIS — R5383 Other fatigue: Secondary | ICD-10-CM | POA: Diagnosis present

## 2021-04-30 DIAGNOSIS — Z87891 Personal history of nicotine dependence: Secondary | ICD-10-CM | POA: Diagnosis not present

## 2021-04-30 DIAGNOSIS — D631 Anemia in chronic kidney disease: Secondary | ICD-10-CM | POA: Diagnosis present

## 2021-04-30 DIAGNOSIS — I129 Hypertensive chronic kidney disease with stage 1 through stage 4 chronic kidney disease, or unspecified chronic kidney disease: Secondary | ICD-10-CM | POA: Diagnosis present

## 2021-04-30 DIAGNOSIS — N1831 Chronic kidney disease, stage 3a: Secondary | ICD-10-CM

## 2021-04-30 DIAGNOSIS — K5900 Constipation, unspecified: Secondary | ICD-10-CM | POA: Diagnosis present

## 2021-04-30 DIAGNOSIS — Z7982 Long term (current) use of aspirin: Secondary | ICD-10-CM | POA: Diagnosis not present

## 2021-04-30 DIAGNOSIS — S72009A Fracture of unspecified part of neck of unspecified femur, initial encounter for closed fracture: Secondary | ICD-10-CM

## 2021-04-30 DIAGNOSIS — S7292XA Unspecified fracture of left femur, initial encounter for closed fracture: Secondary | ICD-10-CM | POA: Diagnosis present

## 2021-04-30 DIAGNOSIS — I739 Peripheral vascular disease, unspecified: Secondary | ICD-10-CM | POA: Diagnosis present

## 2021-04-30 DIAGNOSIS — W19XXXA Unspecified fall, initial encounter: Secondary | ICD-10-CM

## 2021-04-30 DIAGNOSIS — Y92009 Unspecified place in unspecified non-institutional (private) residence as the place of occurrence of the external cause: Secondary | ICD-10-CM

## 2021-04-30 DIAGNOSIS — Z79899 Other long term (current) drug therapy: Secondary | ICD-10-CM | POA: Diagnosis not present

## 2021-04-30 DIAGNOSIS — N183 Chronic kidney disease, stage 3 unspecified: Secondary | ICD-10-CM | POA: Diagnosis present

## 2021-04-30 DIAGNOSIS — S72002A Fracture of unspecified part of neck of left femur, initial encounter for closed fracture: Secondary | ICD-10-CM

## 2021-04-30 DIAGNOSIS — S72142A Displaced intertrochanteric fracture of left femur, initial encounter for closed fracture: Principal | ICD-10-CM | POA: Diagnosis present

## 2021-04-30 DIAGNOSIS — I10 Essential (primary) hypertension: Secondary | ICD-10-CM

## 2021-04-30 DIAGNOSIS — M1990 Unspecified osteoarthritis, unspecified site: Secondary | ICD-10-CM | POA: Diagnosis present

## 2021-04-30 DIAGNOSIS — Z20822 Contact with and (suspected) exposure to covid-19: Secondary | ICD-10-CM | POA: Diagnosis present

## 2021-04-30 DIAGNOSIS — D638 Anemia in other chronic diseases classified elsewhere: Secondary | ICD-10-CM | POA: Diagnosis not present

## 2021-04-30 DIAGNOSIS — Z419 Encounter for procedure for purposes other than remedying health state, unspecified: Secondary | ICD-10-CM

## 2021-04-30 LAB — CBC WITH DIFFERENTIAL/PLATELET
Abs Immature Granulocytes: 0.02 10*3/uL (ref 0.00–0.07)
Basophils Absolute: 0 10*3/uL (ref 0.0–0.1)
Basophils Relative: 1 %
Eosinophils Absolute: 0.1 10*3/uL (ref 0.0–0.5)
Eosinophils Relative: 2 %
HCT: 34 % — ABNORMAL LOW (ref 36.0–46.0)
Hemoglobin: 10.9 g/dL — ABNORMAL LOW (ref 12.0–15.0)
Immature Granulocytes: 0 %
Lymphocytes Relative: 22 %
Lymphs Abs: 1.2 10*3/uL (ref 0.7–4.0)
MCH: 28.5 pg (ref 26.0–34.0)
MCHC: 32.1 g/dL (ref 30.0–36.0)
MCV: 89 fL (ref 80.0–100.0)
Monocytes Absolute: 0.4 10*3/uL (ref 0.1–1.0)
Monocytes Relative: 8 %
Neutro Abs: 3.5 10*3/uL (ref 1.7–7.7)
Neutrophils Relative %: 67 %
Platelets: 199 10*3/uL (ref 150–400)
RBC: 3.82 MIL/uL — ABNORMAL LOW (ref 3.87–5.11)
RDW: 14 % (ref 11.5–15.5)
WBC: 5.2 10*3/uL (ref 4.0–10.5)
nRBC: 0 % (ref 0.0–0.2)

## 2021-04-30 LAB — COMPREHENSIVE METABOLIC PANEL
ALT: 12 U/L (ref 0–44)
AST: 24 U/L (ref 15–41)
Albumin: 3.6 g/dL (ref 3.5–5.0)
Alkaline Phosphatase: 44 U/L (ref 38–126)
Anion gap: 7 (ref 5–15)
BUN: 31 mg/dL — ABNORMAL HIGH (ref 8–23)
CO2: 28 mmol/L (ref 22–32)
Calcium: 11.1 mg/dL — ABNORMAL HIGH (ref 8.9–10.3)
Chloride: 107 mmol/L (ref 98–111)
Creatinine, Ser: 1.19 mg/dL — ABNORMAL HIGH (ref 0.44–1.00)
GFR, Estimated: 42 mL/min — ABNORMAL LOW (ref >60)
Glucose, Bld: 89 mg/dL (ref 70–99)
Potassium: 3.9 mmol/L (ref 3.5–5.1)
Sodium: 142 mmol/L (ref 135–145)
Total Bilirubin: 0.5 mg/dL (ref 0.3–1.2)
Total Protein: 6.1 g/dL — ABNORMAL LOW (ref 6.5–8.1)

## 2021-04-30 LAB — RESP PANEL BY RT-PCR (FLU A&B, COVID) ARPGX2
Influenza A by PCR: NEGATIVE
Influenza B by PCR: NEGATIVE
SARS Coronavirus 2 by RT PCR: NEGATIVE

## 2021-04-30 LAB — TROPONIN I (HIGH SENSITIVITY)
Troponin I (High Sensitivity): 15 ng/L (ref <18)
Troponin I (High Sensitivity): 22 ng/L — ABNORMAL HIGH (ref <18)

## 2021-04-30 MED ORDER — METOPROLOL SUCCINATE ER 50 MG PO TB24
50.0000 mg | ORAL_TABLET | Freq: Every day | ORAL | Status: DC
  Administered 2021-05-01 – 2021-05-06 (×6): 50 mg via ORAL
  Filled 2021-04-30 (×6): qty 1

## 2021-04-30 MED ORDER — CEFAZOLIN SODIUM-DEXTROSE 2-4 GM/100ML-% IV SOLN
2.0000 g | INTRAVENOUS | Status: AC
  Administered 2021-05-01: 2 g via INTRAVENOUS
  Filled 2021-04-30: qty 100

## 2021-04-30 MED ORDER — FAMOTIDINE 20 MG PO TABS
20.0000 mg | ORAL_TABLET | Freq: Every day | ORAL | Status: DC
  Administered 2021-05-01 – 2021-05-06 (×6): 20 mg via ORAL
  Filled 2021-04-30 (×6): qty 1

## 2021-04-30 MED ORDER — ONDANSETRON HCL 4 MG/2ML IJ SOLN
4.0000 mg | Freq: Once | INTRAMUSCULAR | Status: AC
  Administered 2021-04-30: 4 mg via INTRAVENOUS
  Filled 2021-04-30: qty 2

## 2021-04-30 MED ORDER — METHOCARBAMOL 500 MG PO TABS
500.0000 mg | ORAL_TABLET | Freq: Four times a day (QID) | ORAL | Status: DC | PRN
  Filled 2021-04-30: qty 1

## 2021-04-30 MED ORDER — MORPHINE SULFATE (PF) 4 MG/ML IV SOLN
4.0000 mg | Freq: Once | INTRAVENOUS | Status: AC
  Administered 2021-04-30: 4 mg via INTRAVENOUS
  Filled 2021-04-30: qty 1

## 2021-04-30 MED ORDER — MORPHINE SULFATE (PF) 2 MG/ML IV SOLN
0.5000 mg | INTRAVENOUS | Status: DC | PRN
  Administered 2021-04-30 – 2021-05-01 (×3): 0.5 mg via INTRAVENOUS
  Filled 2021-04-30 (×3): qty 1

## 2021-04-30 MED ORDER — METHOCARBAMOL 1000 MG/10ML IJ SOLN
500.0000 mg | Freq: Four times a day (QID) | INTRAVENOUS | Status: DC | PRN
  Administered 2021-04-30: 500 mg via INTRAVENOUS
  Filled 2021-04-30: qty 500
  Filled 2021-04-30: qty 5

## 2021-04-30 MED ORDER — FAMOTIDINE 20 MG PO TABS: 20.0000 mg | ORAL_TABLET | Freq: Two times a day (BID) | ORAL | Status: DC

## 2021-04-30 MED ORDER — HYDROCODONE-ACETAMINOPHEN 5-325 MG PO TABS: 1.0000 | ORAL_TABLET | Freq: Four times a day (QID) | ORAL | Status: DC | PRN

## 2021-04-30 MED ORDER — AMLODIPINE BESYLATE 10 MG PO TABS
10.0000 mg | ORAL_TABLET | Freq: Every day | ORAL | Status: DC
  Administered 2021-04-30 – 2021-05-06 (×6): 10 mg via ORAL
  Filled 2021-04-30: qty 2
  Filled 2021-04-30 (×6): qty 1

## 2021-04-30 MED ORDER — DOCUSATE SODIUM 100 MG PO CAPS: 200.0000 mg | ORAL_CAPSULE | Freq: Two times a day (BID) | ORAL | Status: DC

## 2021-04-30 MED ORDER — CHLORTHALIDONE 25 MG PO TABS: 25.0000 mg | ORAL_TABLET | Freq: Every day | ORAL | Status: DC

## 2021-04-30 MED ORDER — CHOLECALCIFEROL 25 MCG (1000 UT) PO CAPS: 1000.0000 [IU] | ORAL_CAPSULE | Freq: Every day | ORAL | Status: DC

## 2021-04-30 MED ORDER — POLYETHYLENE GLYCOL 3350 17 G PO PACK
17.0000 g | PACK | Freq: Every day | ORAL | Status: DC
  Administered 2021-05-01 – 2021-05-06 (×6): 17 g via ORAL
  Filled 2021-04-30 (×6): qty 1

## 2021-04-30 NOTE — Anesthesia Preprocedure Evaluation (Addendum)
Anesthesia Evaluation  Patient identified by MRN, date of birth, ID band Patient awake    Reviewed: Allergy & Precautions, H&P , NPO status , reviewed documented beta blocker date and time   Airway Mallampati: II  TM Distance: >3 FB Neck ROM: full    Dental  (+) Chipped, Missing, Poor Dentition   Pulmonary former smoker,    Pulmonary exam normal        Cardiovascular hypertension, Normal cardiovascular exam  LVH on EKG   Neuro/Psych    GI/Hepatic neg GERD  ,  Endo/Other    Renal/GU Renal disease     Musculoskeletal  (+) Arthritis ,   Abdominal   Peds  Hematology   Anesthesia Other Findings Past Medical History: No date: Arthritis No date: Constipation No date: Hypertension   Reproductive/Obstetrics                           Anesthesia Physical Anesthesia Plan  ASA: II  Anesthesia Plan: General and Spinal   Post-op Pain Management:    Induction: Intravenous  PONV Risk Score and Plan: Treatment may vary due to age or medical condition and TIVA  Airway Management Planned: Nasal Cannula and Natural Airway  Additional Equipment:   Intra-op Plan:   Post-operative Plan:   Informed Consent: I have reviewed the patients History and Physical, chart, labs and discussed the procedure including the risks, benefits and alternatives for the proposed anesthesia with the patient or authorized representative who has indicated his/her understanding and acceptance.     Dental Advisory Given  Plan Discussed with: CRNA  Anesthesia Plan Comments: (SAB w TIVA discussed and accepted)       Anesthesia Quick Evaluation

## 2021-04-30 NOTE — Plan of Care (Signed)
  Problem: Education: Goal: Verbalization of understanding the information provided (i.e., activity precautions, restrictions, etc) will improve Outcome: Not Progressing Goal: Individualized Educational Video(s) Outcome: Not Progressing   Problem: Activity: Goal: Ability to ambulate and perform ADLs will improve Outcome: Not Progressing   Problem: Clinical Measurements: Goal: Postoperative complications will be avoided or minimized Outcome: Not Progressing   Problem: Self-Concept: Goal: Ability to maintain and perform role responsibilities to the fullest extent possible will improve Outcome: Not Progressing   Problem: Pain Management: Goal: Pain level will decrease Outcome: Not Progressing   

## 2021-04-30 NOTE — ED Notes (Signed)
Informed RN bed assigned 

## 2021-04-30 NOTE — ED Notes (Signed)
Patient transported to CT 

## 2021-04-30 NOTE — H&P (Signed)
History and Physical    Kimberly Stokes YNW:[id number] DOB: [birth year] DOA: 04/30/2021  PCP: Kimberly Aldo, MD   Patient coming from: Home  I have personally briefly reviewed patient's old medical records in [facility]  Chief Complaint: Left hip pain  HPI: Kimberly Stokes is a 87 y.o. female with medical history significant for hypertension, mild cognitive deficits and arthritis to the ER for evaluation of left hip pain by EMS.  Patient lives alone and states that she was coming out of the bathroom and fell.  She is unsure of what happened but denies tripping on any object, feeling dizzy or lightheaded.  She was unable to get up due to pain in her hip but crawled to the telephone and called her daughter for help.  Her daughter then called 911. She denies having any chest pain, no shortness of breath, no abdominal pain, no nausea, no vomiting, no changes in her bowel habits, no fever, no chills, no urinary symptoms, no headache, no cough, no blurred vision, no focal deficits. Labs show sodium 142, potassium 3.9, chloride 107, bicarb 28, glucose 89, BUN 31, creatinine 1.19, calcium 11 phosphatase 44, albumin 3.6, AST 24, ALT 12 total protein 6.1: White count 5.2, hemoglobin 10.9, hematocrit 34, MCV 89, RDW 14, platelet count 199 Respiratory viral panel is pending Chest x-ray reviewed by me shows no acute cardiopulmonary disease Left hip x-ray reviewed by me shows slightly angulated left intertrochanteric hip fracture.Diffuse osteopenia. Degenerative change lumbar spine and both Hips. Peripheral vascular disease. Twelve-lead EKG reviewed by me shows sinus rhythm, diffuse T wave inversions LVH.   ED Course: Patient is an 87 year old African-American female who was brought into the ER by EMS for evaluation of left hip pain.  Patient lives alone and fell while coming out of the bathroom this morning. She has a left intertrochanteric hip fracture. Orthopedic surgery has been consulted and plan is  for surgical repair in a.m.  Review of Systems: As per HPI otherwise all other systems reviewed and negative.    Past Medical History:  Diagnosis Date  . Arthritis   . Constipation   . Hypertension     Past Surgical History:  Procedure Laterality Date  . ABDOMINAL HYSTERECTOMY    . CATARACT EXTRACTION, BILATERAL       reports that she has quit smoking. She has never used smokeless tobacco. She reports that she does not drink alcohol and does not use drugs.  No Known Allergies  Family History  Family history unknown: Yes      Prior to Admission medications   Medication Sig Start Date End Date Taking? Authorizing Provider  amLODipine (NORVASC) 10 MG tablet Take 10 mg by mouth daily.    [provider]  aspirin 81 MG chewable tablet Chew 81 mg by mouth daily.    [provider]  chlorthalidone (HYGROTON) 25 MG tablet Take 25 mg by mouth daily.    [provider]  Cholecalciferol (D3-1000) 1000 units capsule Take 1,000 Units by mouth daily.    [provider]  docusate sodium (COLACE) 100 MG capsule Take 2 capsules (200 mg total) by mouth 2 (two) times daily. 01/16/17   Sharman Cheek, MD  famotidine (PEPCID) 20 MG tablet Take 1 tablet (20 mg total) by mouth 2 (two) times daily. 01/16/17   Sharman Cheek, MD  magnesium oxide (MAG-OX) 400 (241.3 Mg) MG tablet Take 1 tablet (400 mg total) by mouth daily. Patient not taking: Reported on 12/16/2017 12/10/17  Kimberly Rose, MD  metoprolol succinate (TOPROL-XL) 50 MG 24 hr tablet Take 50 mg by mouth daily. Take with or immediately following a meal.    [provider]  polyethylene glycol (MIRALAX / GLYCOLAX) packet Take 17 g by mouth daily.    [provider]  potassium chloride SA (KLOR-CON M20) 20 MEQ tablet Take 1 tablet (20 mEq total) by mouth daily. Patient not taking: Reported on 12/16/2017 12/10/17   Kimberly Rose, MD    Physical Exam: Vitals:   04/30/21 1238 04/30/21  1239 04/30/21 1300 04/30/21 1500  BP:  129/69 (!) 147/66 (!) 167/69  Pulse:  68 68 66  Resp:  19 15 10   Temp:  98 F (36.7 C)    TempSrc:  Oral    SpO2:  100% 100% 92%  Weight: 50 kg     Height: 5\' 2"  (1.575 m)        Vitals:   04/30/21 1238 04/30/21 1239 04/30/21 1300 04/30/21 1500  BP:  129/69 (!) 147/66 (!) 167/69  Pulse:  68 68 66  Resp:  19 15 10   Temp:  98 F (36.7 C)    TempSrc:  Oral    SpO2:  100% 100% 92%  Weight: 50 kg     Height: 5\' 2"  (1.575 m)         Constitutional: Alert and oriented x 3.  Thin and frail.  Appears to be in painful distress  HEENT:      Head: Normocephalic and atraumatic.         Eyes: PERLA, EOMI, Conjunctivae are pale. Sclera is non-icteric.       Mouth/Throat: Mucous membranes are moist.       Neck: Supple with no signs of meningismus. Cardiovascular: Regular rate and rhythm. No murmurs, gallops, or rubs. 2+ symmetrical distal pulses are present . No JVD. No LE edema Respiratory: Respiratory effort normal .Lungs sounds clear bilaterally. No wheezes, crackles, or rhonchi.  Gastrointestinal: Soft, non tender, and non distended with positive bowel sounds.  Genitourinary: No CVA tenderness. Musculoskeletal:  Decreased range of motion left hip. No cyanosis, or erythema of extremities. Neurologic:  Face is symmetric. Moving all extremities. No gross focal neurologic deficits  Skin: Skin is warm, dry.  No rash or ulcers Psychiatric: Mood and affect are normal   Labs on Admission: I have personally reviewed following labs and imaging studies  CBC: Recent Labs  Lab 04/30/21 1236  WBC 5.2  NEUTROABS 3.5  HGB 10.9*  HCT 34.0*  MCV 89.0  PLT 199   Basic Metabolic Panel: Recent Labs  Lab 04/30/21 1236  NA 142  K 3.9  CL 107  CO2 28  GLUCOSE 89  BUN 31*  CREATININE 1.19*  CALCIUM 11.1*   GFR: Estimated Creatinine Clearance: 21.3 mL/min (A) (by C-G formula based on SCr of 1.19 mg/dL (H)). Liver Function Tests: Recent Labs   Lab 04/30/21 1236  AST 24  ALT 12  ALKPHOS 44  BILITOT 0.5  PROT 6.1*  ALBUMIN 3.6   No results for input(s): LIPASE, AMYLASE in the last 168 hours. No results for input(s): AMMONIA in the last 168 hours. Coagulation Profile: No results for input(s): INR, PROTIME in the last 168 hours. Cardiac Enzymes: No results for input(s): CKTOTAL, CKMB, CKMBINDEX, TROPONINI in the last 168 hours. BNP (last 3 results) No results for input(s): PROBNP in the last 8760 hours. HbA1C: No results for input(s): HGBA1C in the last 72 hours. CBG: No results for input(s): GLUCAP  in the last 168 hours. Lipid Profile: No results for input(s): CHOL, HDL, LDLCALC, TRIG, CHOLHDL, LDLDIRECT in the last 72 hours. Thyroid Function Tests: No results for input(s): TSH, T4TOTAL, FREET4, T3FREE, THYROIDAB in the last 72 hours. Anemia Panel: No results for input(s): VITAMINB12, FOLATE, FERRITIN, TIBC, IRON, RETICCTPCT in the last 72 hours. Urine analysis:    Component Value Date/Time   COLORURINE YELLOW (A) 06/27/2018 1613   APPEARANCEUR CLEAR (A) 06/27/2018 1613   LABSPEC 1.021 06/27/2018 1613   PHURINE 5.0 06/27/2018 1613   GLUCOSEU NEGATIVE 06/27/2018 1613   HGBUR SMALL (A) 06/27/2018 1613   BILIRUBINUR NEGATIVE 06/27/2018 1613   KETONESUR NEGATIVE 06/27/2018 1613   PROTEINUR NEGATIVE 06/27/2018 1613   NITRITE NEGATIVE 06/27/2018 1613   LEUKOCYTESUR NEGATIVE 06/27/2018 1613    Radiological Exams on Admission: DG Chest 1 View  Result Date: 04/30/2021 CLINICAL DATA:  Fall EXAM: CHEST  1 VIEW COMPARISON:  2019 FINDINGS: The heart size and mediastinal contours are within normal limits. Both lungs are clear. No pleural effusion. No pneumothorax. The visualized skeletal structures are intact. IMPRESSION: No acute process in the chest Electronically Signed   By: Guadlupe Spanish M.D.   On: 04/30/2021 14:01   CT Head Wo Contrast  Result Date: 04/30/2021 CLINICAL DATA:  Head and neck trauma. EXAM: CT HEAD  WITHOUT CONTRAST CT CERVICAL SPINE WITHOUT CONTRAST TECHNIQUE: Multidetector CT imaging of the head and cervical spine was performed following the standard protocol without intravenous contrast. Multiplanar CT image reconstructions of the cervical spine were also generated. COMPARISON:  None. FINDINGS: CT HEAD FINDINGS Brain: Generalized mild atrophy. Negative for acute infarct, hemorrhage, mass. Mild white matter hypodensity bilaterally. Vascular: Negative for hyperdense vessel Skull: Negative Sinuses/Orbits: Mucosal edema and bony thickening right ethmoid sinus. Remaining sinuses clear. Bilateral cataract extraction. Other: None CT CERVICAL SPINE FINDINGS Alignment: Normal Skull base and vertebrae: Negative for fracture Soft tissues and spinal canal: Negative Disc levels: Multilevel disc degeneration. Disc space narrowing and disc calcification is present at multiple levels. Bilateral facet degeneration is present. No significant spinal stenosis. Upper chest: Lung apices clear bilaterally Other: None IMPRESSION: 1. No acute intracranial abnormality 2. Negative for cervical fracture. Electronically Signed   By: Marlan Palau M.D.   On: 04/30/2021 13:36   CT Cervical Spine Wo Contrast  Result Date: 04/30/2021 CLINICAL DATA:  Head and neck trauma. EXAM: CT HEAD WITHOUT CONTRAST CT CERVICAL SPINE WITHOUT CONTRAST TECHNIQUE: Multidetector CT imaging of the head and cervical spine was performed following the standard protocol without intravenous contrast. Multiplanar CT image reconstructions of the cervical spine were also generated. COMPARISON:  None. FINDINGS: CT HEAD FINDINGS Brain: Generalized mild atrophy. Negative for acute infarct, hemorrhage, mass. Mild white matter hypodensity bilaterally. Vascular: Negative for hyperdense vessel Skull: Negative Sinuses/Orbits: Mucosal edema and bony thickening right ethmoid sinus. Remaining sinuses clear. Bilateral cataract extraction. Other: None CT CERVICAL SPINE  FINDINGS Alignment: Normal Skull base and vertebrae: Negative for fracture Soft tissues and spinal canal: Negative Disc levels: Multilevel disc degeneration. Disc space narrowing and disc calcification is present at multiple levels. Bilateral facet degeneration is present. No significant spinal stenosis. Upper chest: Lung apices clear bilaterally Other: None IMPRESSION: 1. No acute intracranial abnormality 2. Negative for cervical fracture. Electronically Signed   By: Marlan Palau M.D.   On: 04/30/2021 13:36   DG Hip Unilat W or Wo Pelvis 2-3 Views Left  Result Date: 04/30/2021 CLINICAL DATA:  Fall. EXAM: DG HIP (WITH OR WITHOUT PELVIS) 2-3V LEFT COMPARISON:  CT 06/27/2018. FINDINGS: Slightly angulated left intertrochanteric hip fracture. No evidence of dislocation. Osteopenia. Degenerative changes lumbar spine and both hips. Peripheral vascular calcification. Pelvic calcifications consistent phleboliths. IMPRESSION: 1.  Slightly angulated left intertrochanteric hip fracture. 2. Diffuse osteopenia. Degenerative change lumbar spine and both hips. 3.  Peripheral vascular disease. Electronically Signed   By: Maisie Fushomas  Register   On: 04/30/2021 13:54     Assessment/Plan Principal Problem:   Femur fracture, left (HCC) Active Problems:   Hypertension   Arthritis   CKD (chronic kidney disease) stage 3, GFR 30-59 ml/min (HCC)   Hypercalcemia     Left Femur fracture S/P Fall Immobilize left lower extremity Pain control Place patient on muscle relaxants Orthopedic surgery consult Place patient on fall precautions   Hypertension with stage III chronic kidney disease Continue amlodipine and metoprolol Hold chlorthalidone for hypercalcemia     Hypercalcemia Hold vitamin D and chlorthalidone     DVT prophylaxis: SCD Code Status: full code Family Communication: Greater than 50% of time was spent discussing plan of care with patient's daughter at the bedside.  All questions and concerns  have been addressed.  CODE STATUS was discussed and patient is a full code. Disposition Plan: Back to previous home environment Consults called: Orthopedic surgery Status: At the time of admission, it appears that the appropriate admission status for this patient is inpatient. This is judged to be reasonable and necessary in order to promote the required intensity of service to ensure the patient's safety given the presenting symptoms, physical exam findings and initial radiographic and laboratory data in the context of their comorbid conditions. Patient requires inpatient status due to high intensity of service, high risk for further deterioration and high frequency of surveillance required.    Lucile Shuttersochukwu Debraann Livingstone MD [facility]     04/30/2021, 3:14 PM

## 2021-04-30 NOTE — ED Triage Notes (Signed)
Pt BIBA from home for a fall this AM. Pt live alone and was able to crawl to her phone after the fall and call 911. Pt c/o left hip pain. Denies LOC. Vitals stable.

## 2021-04-30 NOTE — Consult Note (Signed)
ORTHOPAEDIC CONSULTATION  REQUESTING PHYSICIAN: Lucile Shutters, MD  Chief Complaint: Left hip pain status post fall  HPI: Kimberly Stokes is a 87 y.o. female who complains of left hip pain status post fall at home.  Patient fell coming out of the bathroom at home.  Patient had left hip pain and was unable to do stand or ambulate following her fall.  Patient was brought to the [facility] emergency department by EMS.  X-rays in the ED revealed a left intertrochanteric hip fracture.  Patient was admitted to the medical service for preoperative clearance.  Orthopedics is consulted for management of the patient's fracture.  Patient denies other injuries including head injury or loss of consciousness.  Patient lives alone and called her daughter for help after she fell.  This evening the patient's daughter and granddaughter at the bedside.  Past Medical History:  Diagnosis Date  . Arthritis   . Constipation   . Hypertension    Past Surgical History:  Procedure Laterality Date  . ABDOMINAL HYSTERECTOMY    . CATARACT EXTRACTION, BILATERAL     Social History   Socioeconomic History  . Marital status: Widowed    Spouse name: Not on file  . Number of children: Not on file  . Years of education: Not on file  . Highest education level: Not on file  Occupational History  . Not on file  Tobacco Use  . Smoking status: Former Games developer  . Smokeless tobacco: Never Used  Substance and Sexual Activity  . Alcohol use: No  . Drug use: No  . Sexual activity: Not on file  Other Topics Concern  . Not on file  Social History Narrative  . Not on file   Social Determinants of Health   Financial Resource Strain: Not on file  Food Insecurity: Not on file  Transportation Needs: Not on file  Physical Activity: Not on file  Stress: Not on file  Social Connections: Not on file   Family History  Family history unknown: Yes   No Known Allergies Prior to Admission medications   Medication Sig  Start Date End Date Taking? Authorizing Provider  aspirin 81 MG chewable tablet Chew 81 mg by mouth daily.   Yes [provider]  chlorthalidone (HYGROTON) 25 MG tablet Take 25 mg by mouth daily.   Yes [provider]  metoprolol succinate (TOPROL-XL) 25 MG 24 hr tablet Take 25 mg by mouth daily.   Yes [provider]  mirtazapine (REMERON) 7.5 MG tablet Take 7.5 mg by mouth at bedtime.   Yes [provider]  Multiple Vitamins-Minerals (MULTIVITAMIN WITH MINERALS) tablet Take 1 tablet by mouth daily.   Yes [provider]  omeprazole (PRILOSEC) 20 MG capsule Take 20 mg by mouth daily.   Yes [provider]   DG Chest 1 View  Result Date: 04/30/2021 CLINICAL DATA:  Fall EXAM: CHEST  1 VIEW COMPARISON:  2019 FINDINGS: The heart size and mediastinal contours are within normal limits. Both lungs are clear. No pleural effusion. No pneumothorax. The visualized skeletal structures are intact. IMPRESSION: No acute process in the chest Electronically Signed   By: Guadlupe Spanish M.D.   On: 04/30/2021 14:01   CT Head Wo Contrast  Result Date: 04/30/2021 CLINICAL DATA:  Head and neck trauma. EXAM: CT HEAD WITHOUT CONTRAST CT CERVICAL SPINE WITHOUT CONTRAST TECHNIQUE: Multidetector CT imaging of the head and cervical spine was performed following the standard protocol without intravenous contrast. Multiplanar CT image reconstructions of  the cervical spine were also generated. COMPARISON:  None. FINDINGS: CT HEAD FINDINGS Brain: Generalized mild atrophy. Negative for acute infarct, hemorrhage, mass. Mild white matter hypodensity bilaterally. Vascular: Negative for hyperdense vessel Skull: Negative Sinuses/Orbits: Mucosal edema and bony thickening right ethmoid sinus. Remaining sinuses clear. Bilateral cataract extraction. Other: None CT CERVICAL SPINE FINDINGS Alignment: Normal Skull base and vertebrae: Negative for fracture Soft tissues and spinal canal: Negative  Disc levels: Multilevel disc degeneration. Disc space narrowing and disc calcification is present at multiple levels. Bilateral facet degeneration is present. No significant spinal stenosis. Upper chest: Lung apices clear bilaterally Other: None IMPRESSION: 1. No acute intracranial abnormality 2. Negative for cervical fracture. Electronically Signed   By: Marlan Palau M.D.   On: 04/30/2021 13:36   CT Cervical Spine Wo Contrast  Result Date: 04/30/2021 CLINICAL DATA:  Head and neck trauma. EXAM: CT HEAD WITHOUT CONTRAST CT CERVICAL SPINE WITHOUT CONTRAST TECHNIQUE: Multidetector CT imaging of the head and cervical spine was performed following the standard protocol without intravenous contrast. Multiplanar CT image reconstructions of the cervical spine were also generated. COMPARISON:  None. FINDINGS: CT HEAD FINDINGS Brain: Generalized mild atrophy. Negative for acute infarct, hemorrhage, mass. Mild white matter hypodensity bilaterally. Vascular: Negative for hyperdense vessel Skull: Negative Sinuses/Orbits: Mucosal edema and bony thickening right ethmoid sinus. Remaining sinuses clear. Bilateral cataract extraction. Other: None CT CERVICAL SPINE FINDINGS Alignment: Normal Skull base and vertebrae: Negative for fracture Soft tissues and spinal canal: Negative Disc levels: Multilevel disc degeneration. Disc space narrowing and disc calcification is present at multiple levels. Bilateral facet degeneration is present. No significant spinal stenosis. Upper chest: Lung apices clear bilaterally Other: None IMPRESSION: 1. No acute intracranial abnormality 2. Negative for cervical fracture. Electronically Signed   By: Marlan Palau M.D.   On: 04/30/2021 13:36   DG Hip Unilat W or Wo Pelvis 2-3 Views Left  Result Date: 04/30/2021 CLINICAL DATA:  Fall. EXAM: DG HIP (WITH OR WITHOUT PELVIS) 2-3V LEFT COMPARISON:  CT 06/27/2018. FINDINGS: Slightly angulated left intertrochanteric hip fracture. No evidence of  dislocation. Osteopenia. Degenerative changes lumbar spine and both hips. Peripheral vascular calcification. Pelvic calcifications consistent phleboliths. IMPRESSION: 1.  Slightly angulated left intertrochanteric hip fracture. 2. Diffuse osteopenia. Degenerative change lumbar spine and both hips. 3.  Peripheral vascular disease. Electronically Signed   By: Maisie Fus  Register   On: 04/30/2021 13:54    Positive ROS: All other systems have been reviewed and were otherwise negative with the exception of those mentioned in the HPI and as above.  Physical Exam: General: Alert, no acute distress  MUSCULOSKELETAL: Left lower extremity: Patient skin is intact.  There is no erythema ecchymosis or significant swelling.  Patient's compartments are soft and compressible.  She has palpable pedal pulses, intact sensation light touch and intact motor function distally.  She has shortening and external rotation of left lower extremity.  Assessment: Displaced left intertrochanteric hip fracture  Plan: I reviewed with the patient and her family the nature of her fracture.  I recommended intramedullary fixation for her left intertrochanteric hip fracture.  I described in detail the surgical procedure as well as the postoperative course.  I discussed the risks and benefits of surgery. The risks include but are not limited to infection, bleeding requiring blood transfusion, nerve or blood vessel injury, joint stiffness or loss of motion, persistent pain, weakness or instability, malunion, nonunion and hardware failure and the need for further surgery. Medical risks include but are not limited to DVT and  pulmonary embolism, myocardial infarction, stroke, pneumonia, respiratory failure and death. Patient and her family understood these risks and wished to proceed.  I answered all her questions.  I reviewed the patient's labs and radiographs in preparation for this case.  Patient will be n.p.o. after midnight tonight.   Patient should not receive any anticoagulation tonight in preparation for surgery tomorrow.   Juanell Fairly, MD    04/30/2021 7:38 PM

## 2021-04-30 NOTE — ED Provider Notes (Signed)
[facility] Emergency Department Provider Note  ____________________________________________   Event Date/Time   First MD Initiated Contact with Patient 04/30/21 1233     (approximate)  I have reviewed the triage vital signs and the nursing notes.   HISTORY  Chief Complaint Fall    HPI Kimberly Stokes is a 87 y.o. female presents emergency department via EMS from her home.  Patient lives alone.  States she was coming out of the bathroom and fell.  Unsure of why she fell.  Had to crawl to the telephone to call her daughter who then called 911.  Patient's been unable to stand secondary to hip pain.  She denies chest pain or shortness of breath.  No syncope.  Patient does take ASA daily    Past Medical History:  Diagnosis Date  . Arthritis   . Constipation   . Hypertension     There are no problems to display for this patient.   Past Surgical History:  Procedure Laterality Date  . ABDOMINAL HYSTERECTOMY    . CATARACT EXTRACTION, BILATERAL      Prior to Admission medications   Medication Sig Start Date End Date Taking? Authorizing Provider  amLODipine (NORVASC) 10 MG tablet Take 10 mg by mouth daily.    [provider]  aspirin 81 MG chewable tablet Chew 81 mg by mouth daily.    [provider]  chlorthalidone (HYGROTON) 25 MG tablet Take 25 mg by mouth daily.    [provider]  Cholecalciferol (D3-1000) 1000 units capsule Take 1,000 Units by mouth daily.    [provider]  docusate sodium (COLACE) 100 MG capsule Take 2 capsules (200 mg total) by mouth 2 (two) times daily. 01/16/17   Sharman Cheek, MD  famotidine (PEPCID) 20 MG tablet Take 1 tablet (20 mg total) by mouth 2 (two) times daily. 01/16/17   Sharman Cheek, MD  magnesium oxide (MAG-OX) 400 (241.3 Mg) MG tablet Take 1 tablet (400 mg total) by mouth daily. Patient not taking: Reported on 12/16/2017 12/10/17   Loleta Rose, MD  metoprolol succinate  (TOPROL-XL) 50 MG 24 hr tablet Take 50 mg by mouth daily. Take with or immediately following a meal.    [provider]  polyethylene glycol (MIRALAX / GLYCOLAX) packet Take 17 g by mouth daily.    [provider]  potassium chloride SA (KLOR-CON M20) 20 MEQ tablet Take 1 tablet (20 mEq total) by mouth daily. Patient not taking: Reported on 12/16/2017 12/10/17   Loleta Rose, MD    Allergies Patient has no known allergies.  History reviewed. No pertinent family history.  Social History Social History   Tobacco Use  . Smoking status: Former Games developer  . Smokeless tobacco: Never Used  Substance Use Topics  . Alcohol use: No  . Drug use: No    Review of Systems  Constitutional: No fever/chills Eyes: No visual changes. ENT: No sore throat. Respiratory: Denies cough Cardiovascular: Denies chest pain Gastrointestinal: Denies abdominal pain Genitourinary: Negative for dysuria. Musculoskeletal: Negative for back pain.  Positive for left hip pain Skin: Negative for rash. Psychiatric: no mood changes,     ____________________________________________   PHYSICAL EXAM:  VITAL SIGNS: ED Triage Vitals  Enc Vitals Group     BP 04/30/21 1239 129/69     Pulse Rate 04/30/21 1239 68     Resp 04/30/21 1239 19     Temp 04/30/21 1239 98 F (36.7 C)     Temp Source 04/30/21  1239 Oral     SpO2 04/30/21 1239 100 %     Weight 04/30/21 1238 110 lb 3.7 oz (50 kg)     Height 04/30/21 1238 5\' 2"  (1.575 m)     Head Circumference --      Peak Flow --      Pain Score 04/30/21 1234 6     Pain Loc --      Pain Edu? --      Excl. in GC? --     Constitutional: Alert and oriented. Well appearing and in no acute distress. Eyes: Conjunctivae are normal.  Head: Atraumatic. Nose: No congestion/rhinnorhea. Mouth/Throat: Mucous membranes are moist.   Neck:  supple no lymphadenopathy noted Cardiovascular: Normal rate, regular rhythm. Heart sounds are normal Respiratory: Normal  respiratory effort.  No retractions, lungs c t a  Abd: soft nontender bs normal all 4 quad GU: deferred Musculoskeletal: Left hip is tender to palpation, left foot is rotated out, right hip is nontender, neurovascular is intact, range of motion was not performed  neurologic:  Normal speech and language.  Skin:  Skin is warm, dry and intact. No rash noted. Psychiatric: Mood and affect are normal. Speech and behavior are normal.  ____________________________________________   LABS (all labs ordered are listed, but only abnormal results are displayed)  Labs Reviewed  COMPREHENSIVE METABOLIC PANEL - Abnormal; Notable for the following components:      Result Value   BUN 31 (*)    Creatinine, Ser 1.19 (*)    Calcium 11.1 (*)    Total Protein 6.1 (*)    GFR, Estimated 42 (*)    All other components within normal limits  CBC WITH DIFFERENTIAL/PLATELET - Abnormal; Notable for the following components:   RBC 3.82 (*)    Hemoglobin 10.9 (*)    HCT 34.0 (*)    All other components within normal limits  RESP PANEL BY RT-PCR (FLU A&B, COVID) ARPGX2  URINALYSIS, COMPLETE (UACMP) WITH MICROSCOPIC  TYPE AND SCREEN  TROPONIN I (HIGH SENSITIVITY)  TROPONIN I (HIGH SENSITIVITY)   ____________________________________________   ____________________________________________  RADIOLOGY  CT of the head, C-spine X-ray of the left hip Chest x-ray  ____________________________________________   PROCEDURES  Procedure(s) performed: EKG   Procedures    ____________________________________________   INITIAL IMPRESSION / ASSESSMENT AND PLAN / ED COURSE  Pertinent labs & imaging results that were available during my care of the patient were reviewed by me and considered in my medical decision making (see chart for details).   Patient 87 year old female presents after fall.  See HPI.  Physical exam shows patient stable at this time  DDx: Fractured hip, compression fracture, subdural,  subarachnoid, MI  Labs and imaging ordered  EKG shows sinus rhythm, see physician read  CBC metabolic panel appear to be normal for the patient, stable at this time.  CT of the head and C-spine are both negative for any acute abnormality Chest x-ray is normal, reviewed by me confirmed by radiology X-ray of the left hip reviewed by me and confirmed by radiology shows a left intertrochanteric fracture  Consult to orthopedist.  Dr. 62 dates is surgical and he will fix tomorrow.  Have medicine admit  Consult to hospitalist   Hospitalist will be admitting the patient.  She is in stable condition at this time  KIMBERLIN SCHEEL was evaluated in Emergency Department on 04/30/2021 for the symptoms described in the history of present illness. She was evaluated in the context of the global  COVID-19 pandemic, which necessitated consideration that the patient might be at risk for infection with the SARS-CoV-2 virus that causes COVID-19. Institutional protocols and algorithms that pertain to the evaluation of patients at risk for COVID-19 are in a state of rapid change based on information released by regulatory bodies including the CDC and federal and state organizations. These policies and algorithms were followed during the patient's care in the ED.    As part of my medical decision making, I reviewed the following data within the electronic MEDICAL RECORD NUMBER History obtained from family, Nursing notes reviewed and incorporated, Labs reviewed , EKG interpreted sinus rhythm, see physician read, Old chart reviewed, Radiograph reviewed , Discussed with admitting physician , A consult was requested and obtained from this/these consultant(s) Orthopedics, Evaluated by EM attending , Notes from prior ED visits and [place] Controlled Substance Database  ____________________________________________   FINAL CLINICAL IMPRESSION(S) / ED DIAGNOSES  Final diagnoses:  Closed left hip fracture, initial encounter  (HCC)  Fall, initial encounter      NEW MEDICATIONS STARTED DURING THIS VISIT:  New Prescriptions   No medications on file     Note:  This document was prepared using Dragon voice recognition software and may include unintentional dictation errors.    Faythe Ghee, PA-C 04/30/21 1439    Shaune Pollack, MD 05/07/21 [phone]

## 2021-05-01 ENCOUNTER — Encounter: Payer: Self-pay | Admitting: Internal Medicine

## 2021-05-01 ENCOUNTER — Inpatient Hospital Stay: Payer: Medicare Other

## 2021-05-01 ENCOUNTER — Inpatient Hospital Stay: Payer: Medicare Other | Admitting: Anesthesiology

## 2021-05-01 ENCOUNTER — Encounter
Admission: EM | Disposition: A | Discharge status: Skilled Nursing Facility | Payer: Self-pay | Source: Home / Self Care | Attending: Internal Medicine

## 2021-05-01 HISTORY — PX: INTRAMEDULLARY (IM) NAIL INTERTROCHANTERIC: SHX5875

## 2021-05-01 LAB — CBC
HCT: 32.5 % — ABNORMAL LOW (ref 36.0–46.0)
Hemoglobin: 10.3 g/dL — ABNORMAL LOW (ref 12.0–15.0)
MCH: 27.7 pg (ref 26.0–34.0)
MCHC: 31.7 g/dL (ref 30.0–36.0)
MCV: 87.4 fL (ref 80.0–100.0)
Platelets: 189 10*3/uL (ref 150–400)
RBC: 3.72 MIL/uL — ABNORMAL LOW (ref 3.87–5.11)
RDW: 13.9 % (ref 11.5–15.5)
WBC: 7.9 10*3/uL (ref 4.0–10.5)
nRBC: 0 % (ref 0.0–0.2)

## 2021-05-01 LAB — BASIC METABOLIC PANEL
Anion gap: 7 (ref 5–15)
BUN: 24 mg/dL — ABNORMAL HIGH (ref 8–23)
CO2: 29 mmol/L (ref 22–32)
Calcium: 10.6 mg/dL — ABNORMAL HIGH (ref 8.9–10.3)
Chloride: 103 mmol/L (ref 98–111)
Creatinine, Ser: 0.92 mg/dL (ref 0.44–1.00)
GFR, Estimated: 57 mL/min — ABNORMAL LOW (ref >60)
Glucose, Bld: 110 mg/dL — ABNORMAL HIGH (ref 70–99)
Potassium: 4 mmol/L (ref 3.5–5.1)
Sodium: 139 mmol/L (ref 135–145)

## 2021-05-01 LAB — URINALYSIS, COMPLETE (UACMP) WITH MICROSCOPIC
Bilirubin Urine: NEGATIVE
Glucose, UA: NEGATIVE mg/dL
Hgb urine dipstick: NEGATIVE
Ketones, ur: NEGATIVE mg/dL
Leukocytes,Ua: NEGATIVE
Nitrite: NEGATIVE
Protein, ur: NEGATIVE mg/dL
Specific Gravity, Urine: 1.017 (ref 1.005–1.030)
pH: 6 (ref 5.0–8.0)

## 2021-05-01 SURGERY — FIXATION, FRACTURE, INTERTROCHANTERIC, WITH INTRAMEDULLARY ROD
Anesthesia: General | Laterality: Left

## 2021-05-01 MED ORDER — FENTANYL CITRATE (PF) 100 MCG/2ML IJ SOLN: 25.0000 ug | INTRAMUSCULAR | Status: DC | PRN

## 2021-05-01 MED ORDER — ACETAMINOPHEN 160 MG/5ML PO SOLN
325.0000 mg | ORAL | Status: DC | PRN
  Filled 2021-05-01: qty 20.3

## 2021-05-01 MED ORDER — TRAMADOL HCL 50 MG PO TABS
50.0000 mg | ORAL_TABLET | Freq: Four times a day (QID) | ORAL | Status: DC
  Administered 2021-05-01 – 2021-05-06 (×14): 50 mg via ORAL
  Filled 2021-05-01 (×15): qty 1

## 2021-05-01 MED ORDER — LACTATED RINGERS IV SOLN: INTRAVENOUS | Status: DC | PRN

## 2021-05-01 MED ORDER — ONDANSETRON HCL 4 MG PO TABS: 4.0000 mg | ORAL_TABLET | Freq: Four times a day (QID) | ORAL | Status: DC | PRN

## 2021-05-01 MED ORDER — ADULT MULTIVITAMIN W/MINERALS CH
1.0000 | ORAL_TABLET | Freq: Every day | ORAL | Status: DC
  Filled 2021-05-01: qty 1

## 2021-05-01 MED ORDER — SODIUM CHLORIDE 0.9 % IV SOLN
75.0000 mL/h | INTRAVENOUS | Status: DC
  Administered 2021-05-01 – 2021-05-02 (×2): 75 mL/h via INTRAVENOUS

## 2021-05-01 MED ORDER — PHENYLEPHRINE HCL (PRESSORS) 10 MG/ML IV SOLN
INTRAVENOUS | Status: DC | PRN
  Administered 2021-05-01: 100 ug via INTRAVENOUS

## 2021-05-01 MED ORDER — PANTOPRAZOLE SODIUM 40 MG PO TBEC
40.0000 mg | DELAYED_RELEASE_TABLET | Freq: Every day | ORAL | Status: DC
  Administered 2021-05-01 – 2021-05-06 (×6): 40 mg via ORAL
  Filled 2021-05-01 (×6): qty 1

## 2021-05-01 MED ORDER — FENTANYL CITRATE (PF) 100 MCG/2ML IJ SOLN
INTRAMUSCULAR | Status: AC
  Filled 2021-05-01: qty 2

## 2021-05-01 MED ORDER — ADULT MULTIVITAMIN W/MINERALS CH
1.0000 | ORAL_TABLET | Freq: Every day | ORAL | Status: DC
  Administered 2021-05-01 – 2021-05-06 (×6): 1 via ORAL
  Filled 2021-05-01 (×6): qty 1

## 2021-05-01 MED ORDER — SODIUM CHLORIDE 0.9 % IV SOLN
INTRAVENOUS | Status: DC | PRN
  Administered 2021-05-01: 40 ug/min via INTRAVENOUS

## 2021-05-01 MED ORDER — MIRTAZAPINE 15 MG PO TABS
7.5000 mg | ORAL_TABLET | Freq: Every day | ORAL | Status: DC
  Administered 2021-05-01 – 2021-05-05 (×4): 7.5 mg via ORAL
  Filled 2021-05-01 (×5): qty 1

## 2021-05-01 MED ORDER — SENNA 8.6 MG PO TABS
1.0000 | ORAL_TABLET | Freq: Two times a day (BID) | ORAL | Status: DC
  Administered 2021-05-01 – 2021-05-06 (×9): 8.6 mg via ORAL
  Filled 2021-05-01 (×10): qty 1

## 2021-05-01 MED ORDER — GENTAMICIN SULFATE 40 MG/ML IJ SOLN
INTRAMUSCULAR | Status: AC
  Filled 2021-05-01: qty 2

## 2021-05-01 MED ORDER — ACETAMINOPHEN 325 MG PO TABS: 325.0000 mg | ORAL_TABLET | ORAL | Status: DC | PRN

## 2021-05-01 MED ORDER — PROSOURCE PLUS PO LIQD
30.0000 mL | Freq: Two times a day (BID) | ORAL | Status: DC
  Administered 2021-05-02 – 2021-05-06 (×9): 30 mL via ORAL
  Filled 2021-05-01 (×11): qty 30

## 2021-05-01 MED ORDER — CHLORHEXIDINE GLUCONATE CLOTH 2 % EX PADS
6.0000 | MEDICATED_PAD | Freq: Every day | CUTANEOUS | Status: DC
  Administered 2021-05-02 – 2021-05-06 (×5): 6 via TOPICAL

## 2021-05-01 MED ORDER — CEFAZOLIN SODIUM-DEXTROSE 2-4 GM/100ML-% IV SOLN
2.0000 g | Freq: Four times a day (QID) | INTRAVENOUS | Status: AC
  Administered 2021-05-01 (×2): 2 g via INTRAVENOUS
  Filled 2021-05-01 (×2): qty 100

## 2021-05-01 MED ORDER — ACETAMINOPHEN 325 MG PO TABS
325.0000 mg | ORAL_TABLET | Freq: Four times a day (QID) | ORAL | Status: DC | PRN
  Administered 2021-05-03 – 2021-05-06 (×3): 650 mg via ORAL
  Filled 2021-05-01 (×4): qty 2

## 2021-05-01 MED ORDER — ENOXAPARIN SODIUM 30 MG/0.3ML IJ SOSY
30.0000 mg | PREFILLED_SYRINGE | INTRAMUSCULAR | Status: DC
  Administered 2021-05-02 – 2021-05-06 (×5): 30 mg via SUBCUTANEOUS
  Filled 2021-05-01 (×5): qty 0.3

## 2021-05-01 MED ORDER — BUPIVACAINE HCL (PF) 0.5 % IJ SOLN
INTRAMUSCULAR | Status: DC | PRN
  Administered 2021-05-01: 2.8 mL

## 2021-05-01 MED ORDER — ONDANSETRON HCL 4 MG/2ML IJ SOLN
INTRAMUSCULAR | Status: DC | PRN
  Administered 2021-05-01: 4 mg via INTRAVENOUS

## 2021-05-01 MED ORDER — PROPOFOL 10 MG/ML IV BOLUS
INTRAVENOUS | Status: DC | PRN
  Administered 2021-05-01: 10 mg via INTRAVENOUS
  Administered 2021-05-01: 20 mg via INTRAVENOUS
  Administered 2021-05-01: 30 mg via INTRAVENOUS

## 2021-05-01 MED ORDER — HYDROCODONE-ACETAMINOPHEN 7.5-325 MG PO TABS: 1.0000 | ORAL_TABLET | Freq: Once | ORAL | Status: DC | PRN

## 2021-05-01 MED ORDER — ALUM & MAG HYDROXIDE-SIMETH 200-200-20 MG/5ML PO SUSP: 30.0000 mL | ORAL | Status: DC | PRN

## 2021-05-01 MED ORDER — METHOCARBAMOL 1000 MG/10ML IJ SOLN
500.0000 mg | Freq: Four times a day (QID) | INTRAVENOUS | Status: DC | PRN
  Filled 2021-05-01: qty 5

## 2021-05-01 MED ORDER — HYDROCODONE-ACETAMINOPHEN 5-325 MG PO TABS
1.0000 | ORAL_TABLET | ORAL | Status: DC | PRN
  Administered 2021-05-01: 1 via ORAL
  Administered 2021-05-02: 2 via ORAL
  Filled 2021-05-01: qty 2
  Filled 2021-05-01: qty 1

## 2021-05-01 MED ORDER — ACETAMINOPHEN 500 MG PO TABS
500.0000 mg | ORAL_TABLET | Freq: Four times a day (QID) | ORAL | Status: AC
  Administered 2021-05-01 – 2021-05-02 (×4): 500 mg via ORAL
  Filled 2021-05-01 (×4): qty 1

## 2021-05-01 MED ORDER — PROPOFOL 10 MG/ML IV BOLUS
INTRAVENOUS | Status: AC
  Filled 2021-05-01: qty 40

## 2021-05-01 MED ORDER — MAGNESIUM CITRATE PO SOLN
1.0000 | Freq: Once | ORAL | Status: DC | PRN
  Filled 2021-05-01: qty 296

## 2021-05-01 MED ORDER — DOCUSATE SODIUM 100 MG PO CAPS
100.0000 mg | ORAL_CAPSULE | Freq: Two times a day (BID) | ORAL | Status: DC
  Administered 2021-05-01 – 2021-05-06 (×9): 100 mg via ORAL
  Filled 2021-05-01 (×10): qty 1

## 2021-05-01 MED ORDER — ONDANSETRON HCL 4 MG/2ML IJ SOLN: 4.0000 mg | Freq: Once | INTRAMUSCULAR | Status: DC | PRN

## 2021-05-01 MED ORDER — MORPHINE SULFATE (PF) 2 MG/ML IV SOLN
0.5000 mg | INTRAVENOUS | Status: DC | PRN
  Administered 2021-05-01: 1 mg via INTRAVENOUS
  Filled 2021-05-01: qty 1

## 2021-05-01 MED ORDER — ONDANSETRON HCL 4 MG/2ML IJ SOLN: 4.0000 mg | Freq: Four times a day (QID) | INTRAMUSCULAR | Status: DC | PRN

## 2021-05-01 MED ORDER — PHENOL 1.4 % MT LIQD
1.0000 | OROMUCOSAL | Status: DC | PRN
  Filled 2021-05-01: qty 177

## 2021-05-01 MED ORDER — SODIUM CHLORIDE 0.9 % IR SOLN
Status: DC | PRN
  Administered 2021-05-01: 2 mL

## 2021-05-01 MED ORDER — POLYETHYLENE GLYCOL 3350 17 G PO PACK: 17.0000 g | PACK | Freq: Every day | ORAL | Status: DC | PRN

## 2021-05-01 MED ORDER — BISACODYL 10 MG RE SUPP: 10.0000 mg | Freq: Every day | RECTAL | Status: DC | PRN

## 2021-05-01 MED ORDER — METHOCARBAMOL 500 MG PO TABS
500.0000 mg | ORAL_TABLET | Freq: Four times a day (QID) | ORAL | Status: DC | PRN
  Administered 2021-05-03 – 2021-05-04 (×2): 500 mg via ORAL
  Filled 2021-05-01 (×2): qty 1

## 2021-05-01 MED ORDER — FENTANYL CITRATE (PF) 100 MCG/2ML IJ SOLN
INTRAMUSCULAR | Status: DC | PRN
  Administered 2021-05-01 (×4): 25 ug via INTRAVENOUS

## 2021-05-01 MED ORDER — ENOXAPARIN SODIUM 40 MG/0.4ML IJ SOSY: 40.0000 mg | PREFILLED_SYRINGE | INTRAMUSCULAR | Status: DC

## 2021-05-01 MED ORDER — PROPOFOL 500 MG/50ML IV EMUL
INTRAVENOUS | Status: DC | PRN
  Administered 2021-05-01: 25 ug/kg/min via INTRAVENOUS

## 2021-05-01 MED ORDER — MENTHOL 3 MG MT LOZG
1.0000 | LOZENGE | OROMUCOSAL | Status: DC | PRN
  Filled 2021-05-01: qty 9

## 2021-05-01 SURGICAL SUPPLY — 44 items
BIT DRILL 4.3MMS DISTAL GRDTED (BIT) ×1 IMPLANT
BNDG COHESIVE 6X5 TAN STRL LF (GAUZE/BANDAGES/DRESSINGS) ×4 IMPLANT
CANISTER SUCT 1200ML W/VALVE (MISCELLANEOUS) IMPLANT
CORTICAL BONE SCR 5.0MM X 46MM (Screw) ×2 IMPLANT
COVER WAND RF STERILE (DRAPES) ×2 IMPLANT
DRAPE 3/4 80X56 (DRAPES) ×4 IMPLANT
DRAPE SURG 17X11 SM STRL (DRAPES) ×4 IMPLANT
DRAPE U-SHAPE 47X51 STRL (DRAPES) ×2 IMPLANT
DRILL 4.3MMS DISTAL GRADUATED (BIT) ×2
DRSG OPSITE POSTOP 3X4 (GAUZE/BANDAGES/DRESSINGS) ×4 IMPLANT
DRSG OPSITE POSTOP 4X14 (GAUZE/BANDAGES/DRESSINGS) IMPLANT
DRSG OPSITE POSTOP 4X6 (GAUZE/BANDAGES/DRESSINGS) ×2 IMPLANT
DURAPREP 26ML APPLICATOR (WOUND CARE) ×4 IMPLANT
ELECT REM PT RETURN 9FT ADLT (ELECTROSURGICAL) ×2
ELECTRODE REM PT RTRN 9FT ADLT (ELECTROSURGICAL) ×1 IMPLANT
GLOVE SURG ORTHO LTX SZ9 (GLOVE) ×4 IMPLANT
GLOVE SURG UNDER POLY LF SZ9 (GLOVE) ×2 IMPLANT
GOWN STRL REUS TWL 2XL XL LVL4 (GOWN DISPOSABLE) ×2 IMPLANT
GOWN STRL REUS W/ TWL LRG LVL3 (GOWN DISPOSABLE) ×1 IMPLANT
GOWN STRL REUS W/TWL LRG LVL3 (GOWN DISPOSABLE) ×2
GUIDEPIN VERSANAIL DSP 3.2X444 (ORTHOPEDIC DISPOSABLE SUPPLIES) ×2 IMPLANT
GUIDEWIRE BALL NOSE 100CM (WIRE) ×2 IMPLANT
HEMOVAC 400CC 10FR (MISCELLANEOUS) IMPLANT
HFN LH 130 DEG 11MM X 380MM (Orthopedic Implant) ×2 IMPLANT
HIP FRAC NAIL LAG SCR 10.5X100 (Orthopedic Implant) ×1 IMPLANT
KIT TURNOVER CYSTO (KITS) ×2 IMPLANT
MANIFOLD NEPTUNE II (INSTRUMENTS) ×2 IMPLANT
MAT ABSORB  FLUID 56X50 GRAY (MISCELLANEOUS) ×1
MAT ABSORB FLUID 56X50 GRAY (MISCELLANEOUS) ×1 IMPLANT
NS IRRIG 1000ML POUR BTL (IV SOLUTION) ×2 IMPLANT
PACK HIP COMPR (MISCELLANEOUS) ×2 IMPLANT
PAD ARMBOARD 7.5X6 YLW CONV (MISCELLANEOUS) ×2 IMPLANT
SCREW BONE CORTICAL 5.0X40 (Screw) ×2 IMPLANT
SCREW BONE CORTICAL 5.0X44 (Screw) ×2 IMPLANT
SCREW CANN THRD AFF 10.5X100 (Orthopedic Implant) ×1 IMPLANT
SCREW CORTICL BON 5.0MM X 46MM (Screw) ×1 IMPLANT
STAPLER SKIN PROX 35W (STAPLE) ×2 IMPLANT
SUCTION FRAZIER HANDLE 10FR (MISCELLANEOUS) ×1
SUCTION TUBE FRAZIER 10FR DISP (MISCELLANEOUS) ×1 IMPLANT
SUT VIC AB 0 CT1 36 (SUTURE) ×4 IMPLANT
SUT VIC AB 2-0 CT1 27 (SUTURE) ×2
SUT VIC AB 2-0 CT1 TAPERPNT 27 (SUTURE) ×1 IMPLANT
SUT VICRYL 0 AB UR-6 (SUTURE) ×2 IMPLANT
SYR 30ML LL (SYRINGE) ×2 IMPLANT

## 2021-05-01 NOTE — Progress Notes (Signed)
Initial Nutrition Assessment  DOCUMENTATION CODES:  Not applicable  INTERVENTION:  Advance diet as medically able.  Once diet is advanced, add:  Magic cup TID with meals, each supplement provides 290 kcal and 9 grams of protein.  30 ml ProSource Plus po BID, each supplement provides 100 kcal and 15 grams of protein.   Add MVI with minerals daily.  NUTRITION DIAGNOSIS:  Increased nutrient needs related to post-op healing,hip fracture as evidenced by estimated needs.  GOAL:  Patient will meet greater than or equal to 90% of their needs  MONITOR:  Diet advancement,PO intake,Supplement acceptance,Labs,Weight trends,I & O's  REASON FOR ASSESSMENT:  Consult,Malnutrition Screening Tool Assessment of nutrition requirement/status,Hip fracture protocol  ASSESSMENT:  87 yo female with a PMH of constipation, HTN, mild cognitive deficits, and arthritis who presents with a L femur fracture.  Pt in procedure. Spoke with daughter. Daughter reports that pt says she is eating well, but daughter does not believe that. She is unsure of her usual intake. Pt lives alone and is stubborn, per daughter.  Also, per daughter, pt has lost about 25 lbs this past year. She reports pt's most recent weight at home was 86 lbs. On admission, pt weighed 110 lbs. Daughter reports her UBW at 109 lbs.  Unable to perform NFPE given pt in surgery at time of RD visit.   RD suspects pt may be malnourished, but RD does not have sufficient evidence for official diagnosis at this time.  Pt currently NPO for surgery. Once diet is advanced, recommend adding Magic Cup TID and ProSource Plus BID to promote intake and aid in healing. Also recommend starting MVI with minerals daily.  Medications: reviewed; colace BID, Pepcid, miralax, morphine PRN (given once today)  Labs: reviewed; Glucose 110, serum Ca 10.6  NUTRITION - FOCUSED PHYSICAL EXAM: Unable to complete  Diet Order:   Diet Order            Diet NPO time  specified  Diet effective midnight                EDUCATION NEEDS:  Education needs have been addressed  Skin:  Skin Assessment: Reviewed RN Assessment  Last BM:  04/30/21; OBR  Height:  Ht Readings from Last 1 Encounters:  05/01/21 5\' 2"  (1.575 m)   Weight:  Wt Readings from Last 1 Encounters:  05/01/21 50 kg   Ideal Body Weight:  50 kg  BMI:  Body mass index is 20.16 kg/m.  Estimated Nutritional Needs:  Kcal:  1400-1600 Protein:  65-80 grams Fluid:  >1.4 L  07/01/21, RD, LDN Registered Dietitian After Hours/Weekend Pager # in [place]

## 2021-05-01 NOTE — Discharge Instructions (Signed)
Nutrition Post Hospital Stay Proper nutrition can help your body recover from illness and injury.   Foods and beverages high in protein, vitamins, and minerals help rebuild muscle loss, promote healing, & reduce fall risk.   .In addition to eating healthy foods, a nutrition shake is an easy, delicious way to get the nutrition you need during and after your hospital stay  It is recommended that you continue to drink 2 bottles per day of: Boost for at least 1 month (30 days) after your hospital stay and when you do not feel like eating a full meal.   Tips for adding a nutrition shake into your routine: As allowed, drink one with vitamins or medications instead of water or juice Enjoy one as a tasty mid-morning or afternoon snack Drink cold or make a milkshake out of it Drink one instead of milk with cereal or snacks Use as a coffee creamer   Available at the following grocery stores and pharmacies:           * Karin Golden * [employer] * [employer]  * R[employer] Aid          * Walmart * [employer]  * Wal[facility]reens      * Target  * [employer]J's   * [facility]  * [employer]   * [facility] [phone]            For COUPONS visit: www.ensure.com/join or RoleLink.com.br   Suggested Substitutions Ensure Plus = Boost Plus = Carnation Breakfast Essentials = Boost Compact Ensure Active Clear = Boost Breeze Glucerna Shake = Boost Glucose Control = Carnation Breakfast Essentials SUGAR FREE  Suggestions For Increasing Calories And Protein . Several small meals a day are easier to eat and digest than three large ones. Space meals about 2 to 3 hours apart to maximize comfort. . Stop eating 2 to 3 hours before bed and sleep with your head elevated if gastric reflux (GERD) and heartburn are problems. . Do not eat your favorite foods if you are feeling bad. Save them for when you feel good! . Eat breakfast-type foods at any meal. Eggs are usually easy to eat and are great any time of the day.  (The same goes for pancakes and waffles.) . Eat when you feel hungry. Most people have the greatest appetite in the morning because they have not eaten all night. If this is the best meal for you, then pile on those calories and other nutrients in the morning and at lunch. Then you can have a smaller dinner without losing total calories for the day. . Eat leftovers or nutritious snacks in the afternoon and early evening to round out your day. . Try homemade or commercially prepared nutrition bars and puddings, as well as calorie- and protein-rich liquid nutritional supplements. Benefits of Physical Activity Talk to your doctor about physical activity. Light or moderate physical activity can help maintain muscle and promote an appetite. Walking in the neighborhood or the local mall is a great way to get up, get out, and get moving. If you are unsteady on your feet, try walking around the dining room table. Save Room for [employer]! Drink most fluids between meals instead of with meals. (It is fine to have a sip to help swallow food at meal time.) Fluids (which usually have fewer calories and nutrients than solid food) can take up valuable space in your stomach.  Foods Recommended High-Protein Foods Milk products Add cheese to toast, crackers, sandwiches,  baked potatoes, vegetables, soups, noodles, meat, and fruit. Use reduced-fat (2%) or whole milk in place of water when cooking cereal and cream soups. Include cream sauces on vegetables and pasta. Add powdered milk to cream soups and mashed potatoes.  Eggs Have hard-cooked eggs readily available in the refrigerator. Chop and add to salads, casseroles, soups, and vegetables. Make a quick egg salad. All eggs should be well cooked to avoid the risk of harmful bacteria.  Meats, poultry, and fish Add leftover cooked meats to soups, casseroles, salads, and omelets. Make dip by mixing diced, chopped, or shredded meat with sour cream and spices.  Beans,  legumes, nuts, and seeds Sprinkle nuts and seeds on cereals, fruit, and desserts such as ice cream, pudding, and custard. Also serve nuts and seeds on vegetables, salads, and pasta. Spread peanut butter on toast, bread, English muffins, and fruit, or blend it in a milk shake. Add beans and peas to salads, soups, casseroles, and vegetable dishes.  High-Calorie Foods Butter, margarine, and  oils Melt butter or margarine over potatoes, rice, pasta, and cooked vegetables. Add melted butter or margarine into soups and casseroles and spread on bread for sandwiches before spreading sandwich spread or peanut butter. Saut or stir-fry vegetables, meats, chicken and fish such as shrimp/scallops in olive or canola oil. A variety of oils add calories and can be used to [employer], chicken, or fish.  Milk products Add whipping cream to desserts, pancakes, waffles, fruit, and hot chocolate, and fold it into soups and casseroles. Add sour cream to baked potatoes and vegetables.  Salad dressing Use regular (not low-fat or diet) mayonnaise and salad dressing on sandwiches and in dips with vegetables and fruit.   Sweets Add jelly and honey to bread and crackers. Add jam to fruit and ice cream and as a topping over cake.   Copyright 2020  Academy of [employer]. All rights reserved.

## 2021-05-01 NOTE — Op Note (Signed)
05/01/2021  2:34 PM  PATIENT:  Kimberly Stokes    PRE-OPERATIVE DIAGNOSIS:  Left intertrochanteric hip Fracture  POST-OPERATIVE DIAGNOSIS:  Same  PROCEDURE: Intramedullary fixation for left intertrochanteric hip fracture  SURGEON:  Juanell Fairly, MD  ANESTHESIA:   Spinal  EBL: 50 cc  IMPLANT:  ZIMMER BIOMET AFFIXUS NAIL 29mm x 380 mm with a 100 mm lag screw and distal interlocking screws 40 mm  and 46 mm in length.  PREOPERATIVE INDICATIONS:  Kimberly Stokes is a  87 y.o. female with a diagnosis of Left intertrochanteric hip Fracture.  Patient was recommended for intramedullary fixation of this intertrochanteric hip fracture.  Reviewed the details of the operation as well as the postoperative course with the patient and her family.  Patient and family understood and agreed with this plan.    The risks, benefits and alternatives were discussed with the patient and their family.  The risks include but are not limited to infection, bleeding requiring blood transfusion, nerve or blood vessel injury, malunion, nonunion, hardware prominence, hardware failure, leg length discrepancy or change in lower extremity rotation and need for further surgery including hardware removal with conversion to a total hip arthroplasty. Medical risks include but are not limited to DVT and pulmonary embolism, myocardial infarction, stroke, pneumonia, respiratory failure and death. The patient and their family understood these risks and wished to proceed with surgery.  OPERATIVE PROCEDURE:  The patient was brought to the operating room and placed in the supine position on the fracture table. The patient received final anesthesia.  A closed reduction was performed under C-arm guidance.  The fracture reduction was confirmed on both AP and lateral views. After adequate reduction was achieved, a time out was performed to verify the patient's name, date of birth, medical record number, correct site of surgery correct  procedure to be performed. The timeout was also used to verify the patient received antibiotics and all appropriate instruments, implants and radiographic studies were available in the room. Once all in attendance were in agreement, the case began. The patient was prepped and draped in a sterile fashion.  She received preoperative antibiotics with 2 g of Ancef IV.  An incision was made proximal to the greater trochanter in line with the femur. A guidewire was placed over the tip of the greater trochanter and advanced by drill into the proximal femur to the level of the lesser trochanter.  Confirmation of the drill pin position was made on AP and lateral C-arm images.  The threaded guidepin was then overdrilled with the proximal femoral entry reamer.  A ball-tipped guidewire was then advanced down the intramedullary canal, across the fracture, and down the femoral shaft to the knee.   The ball tip guidewire's position was confirmed at both the knee and hip via C-arm imaging. A depth gauge was used to measure the length of the long nail to be used. It was measured to be 380 mm. Sequential reamers were used to prepare the intramedullary canal up to 13 mm. The actual nail was then inserted into the proximal femur, across the fracture site and down the femoral shaft. Its position was confirmed on AP and lateral C-arm images.  The ball tip guidewire was then removed.  Once the nail was completely seated, the drill guide for the lag screw was placed through the guide arm for the Affixus nail. A guidepin was then placed through this drill guide and advanced through the lateral cortex of the femur, across the fracture  site and into the femoral head achieving a tip apex distance of less than 25 mm. The length of the drill pin was measured to be 100 mm, and then the drill for the lag screw was advanced through the lateral cortex, across the fracture site and up into the femoral head to the depth of the lag screw..  The lag  screw was then advanced by hand into position across the fracture site into the femoral head. Its final position was confirmed on AP and lateral C-arm images. Compression was applied as traction was carefully released. The set screw in the top of the intramedullary rod was tightened by hand using a screwdriver. It was backed off a quarter turn to allow for compression at the fracture site.  The attention was then turned to placement of the distal interlocking screws. A perfect circle technique was used. 2 small stab incisions were made over the distal interlocking screw holes.  A free hand technique was used to drill both distal interlocking screws. The depth of the screw holes was measured with a depth gauge. The 40 mm and 46 mm screws were then advanced into position and tightened by hand. Final C-arm images of the entire intramedullary construct were taken in both the AP and lateral planes.   The wounds were irrigated copiously and closed with 0 Vicryl for closure of the deep fascia and 2-0 Vicryl for subcutaneous closure. The skin was approximated with staples. A dry sterile dressing was applied. I was scrubbed and present the entire case and all sharp, sponge and instrument counts were correct at the conclusion of the case. Patient was transferred to a hospital bed and brought to PACU in stable condition.     Kimberly Devoid, MD

## 2021-05-01 NOTE — Progress Notes (Signed)
The patient has been re-examined, and the chart reviewed, and there have been no interval changes to the documented history and physical.    The risks, benefits, and alternatives have been discussed at length last night, and the patient and her family are willing to proceed with intramedullary fixation of the left intertrochanteric hip fracture..  The left hip was marked according the hospital's correct site of surgery protocol.  I have reviewed the patient's labs and radiographs in preparation for this case.

## 2021-05-01 NOTE — Progress Notes (Signed)
PROGRESS NOTE    Kimberly Stokes  PNT:[id number] DOB: 1920-12-05 DOA: 04/30/2021 PCP: Hillery Aldo, MD   Assessment & Plan:   Principal Problem:   Femur fracture, left (HCC) Active Problems:   Hypertension   Arthritis   CKD (chronic kidney disease) stage 3, GFR 30-59 ml/min (HCC)   Hypercalcemia   Left femur fracture: secondary to fall at home. Will go for surgery today as per ortho surg. Morphine, norco prn   HTN: continue on amlodipine, metoprolol  CKDIIIa: Cr is within baseline range. Avoid nephrotoxic meds   Likely ACD: likely secondary CKD. No need for a transfusion currently   Hypercalcemia: continue to hold vitamin D & chlorthalidone  Hyperglycemia: no hx of DM. Will continue to monitor   DVT prophylaxis: SCDs Code Status: full  Family Communication: called pt's daughter, [name], no answer and unable to leave a voicemail  Disposition Plan: depends on PT/OT recs  Level of care: Med-Surg   Status is: Inpatient  Remains inpatient appropriate because:Ongoing diagnostic testing needed not appropriate for outpatient work up, Unsafe d/c plan, IV treatments appropriate due to intensity of illness or inability to take PO and Inpatient level of care appropriate due to severity of illness   Dispo: The patient is from: Home              Anticipated d/c is to: SNF              Patient currently is not medically stable to d/c.   Difficult to place patient : unclear    Consultants:   Ortho surg    Procedures:    Antimicrobials:    Subjective: Pt c/o malaise   Objective: Vitals:   04/30/21 1757 04/30/21 1958 05/01/21 0011 05/01/21 0516  BP: (!) 175/76 (!) 159/71 (!) 154/64 (!) 143/60  Pulse: 63 63 60 62  Resp: 17 16 15 16   Temp: 98.1 F (36.7 C) 98.5 F (36.9 C) 98.2 F (36.8 C) 98.6 F (37 C)  TempSrc: Oral Oral Oral Oral  SpO2: 100% 100% 100% 99%  Weight:      Height:        Intake/Output Summary (Last 24 hours) at 05/01/2021 0801 Last data filed  at 05/01/2021 0432 Gross per 24 hour  Intake 50.44 ml  Output --  Net 50.44 ml   Filed Weights   04/30/21 1238  Weight: 50 kg    Examination:  General exam: Appears calm and comfortable  Respiratory system: Clear to auscultation. Respiratory effort normal. Cardiovascular system: S1 & S2 +. No rubs, gallops or clicks Gastrointestinal system: Abdomen is nondistended, soft and nontender.  Normal bowel sounds heard. Central nervous system: Alert and oriented to person, place, month but not year. Moves all extremities  Psychiatry: Judgement and insight appear normal. Mood & affect appropriate.     Data Reviewed: I have personally reviewed following labs and imaging studies  CBC: Recent Labs  Lab 04/30/21 1236 05/01/21 0610  WBC 5.2 7.9  NEUTROABS 3.5  --   HGB 10.9* 10.3*  HCT 34.0* 32.5*  MCV 89.0 87.4  PLT 199 189   Basic Metabolic Panel: Recent Labs  Lab 04/30/21 1236 05/01/21 0610  NA 142 139  K 3.9 4.0  CL 107 103  CO2 28 29  GLUCOSE 89 110*  BUN 31* 24*  CREATININE 1.19* 0.92  CALCIUM 11.1* 10.6*   GFR: Estimated Creatinine Clearance: 27.6 mL/min (by C-G formula based on SCr of 0.92 mg/dL). Liver Function Tests: Recent Labs  Lab 04/30/21 1236  AST 24  ALT 12  ALKPHOS 44  BILITOT 0.5  PROT 6.1*  ALBUMIN 3.6   No results for input(s): LIPASE, AMYLASE in the last 168 hours. No results for input(s): AMMONIA in the last 168 hours. Coagulation Profile: No results for input(s): INR, PROTIME in the last 168 hours. Cardiac Enzymes: No results for input(s): CKTOTAL, CKMB, CKMBINDEX, TROPONINI in the last 168 hours. BNP (last 3 results) No results for input(s): PROBNP in the last 8760 hours. HbA1C: No results for input(s): HGBA1C in the last 72 hours. CBG: No results for input(s): GLUCAP in the last 168 hours. Lipid Profile: No results for input(s): CHOL, HDL, LDLCALC, TRIG, CHOLHDL, LDLDIRECT in the last 72 hours. Thyroid Function Tests: No results  for input(s): TSH, T4TOTAL, FREET4, T3FREE, THYROIDAB in the last 72 hours. Anemia Panel: No results for input(s): VITAMINB12, FOLATE, FERRITIN, TIBC, IRON, RETICCTPCT in the last 72 hours. Sepsis Labs: No results for input(s): PROCALCITON, LATICACIDVEN in the last 168 hours.  Recent Results (from the past 240 hour(s))  Resp Panel by RT-PCR (Flu A&B, Covid) Nasopharyngeal Swab     Status: None   Collection Time: 04/30/21  1:59 PM   Specimen: Nasopharyngeal Swab; Nasopharyngeal(NP) swabs in vial transport medium  Result Value Ref Range Status   SARS Coronavirus 2 by RT PCR NEGATIVE NEGATIVE Final    Comment: (NOTE) SARS-CoV-2 target nucleic acids are NOT DETECTED.  The SARS-CoV-2 RNA is generally detectable in upper respiratory specimens during the acute phase of infection. The lowest concentration of SARS-CoV-2 viral copies this assay can detect is 138 copies/mL. A negative result does not preclude SARS-Cov-2 infection and should not be used as the sole basis for treatment or other patient management decisions. A negative result may occur with  improper specimen collection/handling, submission of specimen other than nasopharyngeal swab, presence of viral mutation(s) within the areas targeted by this assay, and inadequate number of viral copies(<138 copies/mL). A negative result must be combined with clinical observations, patient history, and epidemiological information. The expected result is Negative.  Fact Sheet for Patients:  BloggerCourse.com  Fact Sheet for Healthcare Providers:  SeriousBroker.it  This test is no t yet approved or cleared by the [place] FDA and  has been authorized for detection and/or diagnosis of SARS-CoV-2 by FDA under an Emergency Use Authorization (EUA). This EUA will remain  in effect (meaning this test can be used) for the duration of the COVID-19 declaration under Section 564(b)(1) of the Act,  21 U.S.C.section 360bbb-3(b)(1), unless the authorization is terminated  or revoked sooner.       Influenza A by PCR NEGATIVE NEGATIVE Final   Influenza B by PCR NEGATIVE NEGATIVE Final    Comment: (NOTE) The Xpert Xpress SARS-CoV-2/FLU/RSV plus assay is intended as an aid in the diagnosis of influenza from Nasopharyngeal swab specimens and should not be used as a sole basis for treatment. Nasal washings and aspirates are unacceptable for Xpert Xpress SARS-CoV-2/FLU/RSV testing.  Fact Sheet for Patients: BloggerCourse.com  Fact Sheet for Healthcare Providers: SeriousBroker.it  This test is not yet approved or cleared by the [place] FDA and has been authorized for detection and/or diagnosis of SARS-CoV-2 by FDA under an Emergency Use Authorization (EUA). This EUA will remain in effect (meaning this test can be used) for the duration of the COVID-19 declaration under Section 564(b)(1) of the Act, 21 U.S.C. section 360bbb-3(b)(1), unless the authorization is terminated or revoked.  Performed at [facility], [address] [place], [place] [address]          Radiology Studies: DG Chest 1 View  Result Date: 04/30/2021 CLINICAL DATA:  Fall EXAM: CHEST  1 VIEW COMPARISON:  2019 FINDINGS: The heart size and mediastinal contours are within normal limits. Both lungs are clear. No pleural effusion. No pneumothorax. The visualized skeletal structures are intact. IMPRESSION: No acute process in the chest Electronically Signed   By: Guadlupe Spanish M.D.   On: 04/30/2021 14:01   CT Head Wo Contrast  Result Date: 04/30/2021 CLINICAL DATA:  Head and neck trauma. EXAM: CT HEAD WITHOUT CONTRAST CT CERVICAL SPINE WITHOUT CONTRAST TECHNIQUE: Multidetector CT imaging of the head and cervical spine was performed following the standard protocol without intravenous contrast. Multiplanar CT image reconstructions of the cervical spine  were also generated. COMPARISON:  None. FINDINGS: CT HEAD FINDINGS Brain: Generalized mild atrophy. Negative for acute infarct, hemorrhage, mass. Mild white matter hypodensity bilaterally. Vascular: Negative for hyperdense vessel Skull: Negative Sinuses/Orbits: Mucosal edema and bony thickening right ethmoid sinus. Remaining sinuses clear. Bilateral cataract extraction. Other: None CT CERVICAL SPINE FINDINGS Alignment: Normal Skull base and vertebrae: Negative for fracture Soft tissues and spinal canal: Negative Disc levels: Multilevel disc degeneration. Disc space narrowing and disc calcification is present at multiple levels. Bilateral facet degeneration is present. No significant spinal stenosis. Upper chest: Lung apices clear bilaterally Other: None IMPRESSION: 1. No acute intracranial abnormality 2. Negative for cervical fracture. Electronically Signed   By: Marlan Palau M.D.   On: 04/30/2021 13:36   CT Cervical Spine Wo Contrast  Result Date: 04/30/2021 CLINICAL DATA:  Head and neck trauma. EXAM: CT HEAD WITHOUT CONTRAST CT CERVICAL SPINE WITHOUT CONTRAST TECHNIQUE: Multidetector CT imaging of the head and cervical spine was performed following the standard protocol without intravenous contrast. Multiplanar CT image reconstructions of the cervical spine were also generated. COMPARISON:  None. FINDINGS: CT HEAD FINDINGS Brain: Generalized mild atrophy. Negative for acute infarct, hemorrhage, mass. Mild white matter hypodensity bilaterally. Vascular: Negative for hyperdense vessel Skull: Negative Sinuses/Orbits: Mucosal edema and bony thickening right ethmoid sinus. Remaining sinuses clear. Bilateral cataract extraction. Other: None CT CERVICAL SPINE FINDINGS Alignment: Normal Skull base and vertebrae: Negative for fracture Soft tissues and spinal canal: Negative Disc levels: Multilevel disc degeneration. Disc space narrowing and disc calcification is present at multiple levels. Bilateral facet degeneration  is present. No significant spinal stenosis. Upper chest: Lung apices clear bilaterally Other: None IMPRESSION: 1. No acute intracranial abnormality 2. Negative for cervical fracture. Electronically Signed   By: Marlan Palau M.D.   On: 04/30/2021 13:36   DG Hip Unilat W or Wo Pelvis 2-3 Views Left  Result Date: 04/30/2021 CLINICAL DATA:  Fall. EXAM: DG HIP (WITH OR WITHOUT PELVIS) 2-3V LEFT COMPARISON:  CT 06/27/2018. FINDINGS: Slightly angulated left intertrochanteric hip fracture. No evidence of dislocation. Osteopenia. Degenerative changes lumbar spine and both hips. Peripheral vascular calcification. Pelvic calcifications consistent phleboliths. IMPRESSION: 1.  Slightly angulated left intertrochanteric hip fracture. 2. Diffuse osteopenia. Degenerative change lumbar spine and both hips. 3.  Peripheral vascular disease. Electronically Signed   By: Maisie Fus  Register   On: 04/30/2021 13:54        Scheduled Meds: . amLODipine  10 mg Oral Daily  . docusate sodium  200 mg Oral BID  . famotidine  20 mg Oral Daily  . metoprolol succinate  50 mg Oral Daily  . polyethylene glycol  17 g Oral Daily   Continuous Infusions: .  ceFAZolin (  ANCEF) IV    . methocarbamol (ROBAXIN) IV Stopped (04/30/21 1541)     LOS: 1 day    Time spent: 31 mins     Charise KillianJamiese M Amilio Zehnder, MD [facility] Pager [phone]  If 7PM-7AM, please contact night-coverage 05/01/2021, 8:01 AM

## 2021-05-01 NOTE — TOC Progression Note (Deleted)
Transition of Care [facility]) - Progression Note    Patient Details  Name: Kimberly Stokes MRN: [id number] Date of Birth: 01-16-1921  Transition of Care [facility]) CM/SW [place], RN Phone Number: 05/01/2021, 4:26 PM  Clinical Narrative:    Met with the patient to discuss DC plan and needs She has transportation and can afford her medications She has a 3 in 1 at home but needs a RW, this will be brought in by [employer] for her to take home She is set up with [facility]         Expected Discharge Plan and Services                                                 Social Determinants of Health (SDOH) Interventions    Readmission Risk Interventions No flowsheet data found.

## 2021-05-01 NOTE — Transfer of Care (Signed)
Immediate Anesthesia Transfer of Care Note  Patient: Kimberly Stokes  Procedure(s) Performed: INTRAMEDULLARY (IM) NAIL INTERTROCHANTRIC (Left )  Patient Location: PACU  Anesthesia Type:General and Spinal  Level of Consciousness: drowsy  Airway & Oxygen Therapy: Patient Spontanous Breathing and Patient connected to face mask oxygen  Post-op Assessment: Report given to RN  Post vital signs: stable  Last Vitals:  Vitals Value Taken Time  BP    Temp    Pulse    Resp 15 05/01/21 1422  SpO2    Vitals shown include unvalidated device data.  Last Pain:  Vitals:   05/01/21 1055  TempSrc: Temporal  PainSc: 0-No pain         Complications: No complications documented.

## 2021-05-01 NOTE — Anesthesia Procedure Notes (Signed)
Spinal  Patient location during procedure: OR Start time: 05/01/2021 12:34 PM End time: 05/01/2021 12:39 PM Reason for block: surgical anesthesia Staffing Performed: resident/CRNA and anesthesiologist  Anesthesiologist: Alphonsus Sias, MD Resident/CRNA: Lerry Liner, CRNA Preanesthetic Checklist Completed: patient identified, IV checked, site marked, risks and benefits discussed, surgical consent, monitors and equipment checked, pre-op evaluation and timeout performed Spinal Block Patient position: sitting Prep: ChloraPrep Patient monitoring: heart rate, cardiac monitor, continuous pulse ox and blood pressure Approach: midline Location: L3-4 Injection technique: single-shot Needle Needle type: Pencil-Tip  Needle gauge: 24 G Needle length: 9 cm Needle insertion depth: 5 cm Assessment Sensory level: T8 Events: CSF return Additional Notes IV functioning, monitors applied to pt. Expiration date of kit checked and confirmed to be in date. Sterile prep and drape, hand hygiene and sterile gloved used. Pt was positioned and spine was prepped in sterile fashion. Skin was anesthetized with lidocaine. Free flow of clear CSF obtained prior to injecting local anesthetic into CSF x 2 attempts. Spinal needle aspirated freely following injection. Needle was carefully withdrawn, and pt tolerated procedure well. Loss of motor and sensory on exam post injection.

## 2021-05-02 ENCOUNTER — Encounter: Payer: Self-pay | Admitting: Orthopedic Surgery

## 2021-05-02 DIAGNOSIS — D696 Thrombocytopenia, unspecified: Secondary | ICD-10-CM

## 2021-05-02 LAB — CBC
HCT: 24.1 % — ABNORMAL LOW (ref 36.0–46.0)
Hemoglobin: 7.8 g/dL — ABNORMAL LOW (ref 12.0–15.0)
MCH: 28.1 pg (ref 26.0–34.0)
MCHC: 32.4 g/dL (ref 30.0–36.0)
MCV: 86.7 fL (ref 80.0–100.0)
Platelets: 144 10*3/uL — ABNORMAL LOW (ref 150–400)
RBC: 2.78 MIL/uL — ABNORMAL LOW (ref 3.87–5.11)
RDW: 13.9 % (ref 11.5–15.5)
WBC: 7.9 10*3/uL (ref 4.0–10.5)
nRBC: 0 % (ref 0.0–0.2)

## 2021-05-02 LAB — BASIC METABOLIC PANEL
Anion gap: 7 (ref 5–15)
BUN: 25 mg/dL — ABNORMAL HIGH (ref 8–23)
CO2: 27 mmol/L (ref 22–32)
Calcium: 9.6 mg/dL (ref 8.9–10.3)
Chloride: 101 mmol/L (ref 98–111)
Creatinine, Ser: 1.05 mg/dL — ABNORMAL HIGH (ref 0.44–1.00)
GFR, Estimated: 48 mL/min — ABNORMAL LOW (ref >60)
Glucose, Bld: 103 mg/dL — ABNORMAL HIGH (ref 70–99)
Potassium: 4 mmol/L (ref 3.5–5.1)
Sodium: 135 mmol/L (ref 135–145)

## 2021-05-02 NOTE — NC FL2 (Signed)
[place] MEDICAID FL2 LEVEL OF CARE SCREENING TOOL     IDENTIFICATION  Patient Name: Kimberly Stokes Birthdate: Sep 02, 1921 Sex: female Admission Date (Current Location): 04/30/2021  [place] and [place] Number:  Chiropodist and Address:  [facility], [address], [place], [place] [address]      Provider Number: [id number]  Attending Physician Name and Address:  Charise Killian, MD  Relative Name and Phone Number:  Andrey Spearman [phone]    Current Level of Care: Hospital Recommended Level of Care: Skilled Nursing Facility Prior Approval Number:    Date Approved/Denied:   PASRR Number: [id number]  Discharge Plan: SNF    Current Diagnoses: Patient Active Problem List   Diagnosis Date Noted  . Femur fracture, left (HCC) 04/30/2021  . Hypertension   . Arthritis   . CKD (chronic kidney disease) stage 3, GFR 30-59 ml/min (HCC)   . Hypercalcemia     Orientation RESPIRATION BLADDER Height & Weight     Self,Time,Situation,Place  Normal Continent Weight: 50 kg Height:  5\' 2"  (157.5 cm)  BEHAVIORAL SYMPTOMS/MOOD NEUROLOGICAL BOWEL NUTRITION STATUS      Continent Diet  AMBULATORY STATUS COMMUNICATION OF NEEDS Skin   Extensive Assist Verbally Normal                       Personal Care Assistance Level of Assistance  Bathing,Dressing Bathing Assistance: Limited assistance   Dressing Assistance: Limited assistance     Functional Limitations Info             SPECIAL CARE FACTORS FREQUENCY  PT (By licensed PT),OT (By licensed OT)     PT Frequency: min 5xweek OT Frequency: min 5xweek            Contractures      Additional Factors Info                  Current Medications (05/02/2021):  This is the current hospital active medication list Current Facility-Administered Medications  Medication Dose Route Frequency Provider Last Rate Last Admin  . (feeding supplement) PROSource Plus liquid 30 mL  30 mL  Oral BID BM 07/02/2021, MD   30 mL at 05/02/21 1400  . 0.9 %  sodium chloride infusion  75 mL/hr Intravenous Continuous 07/02/21, MD 75 mL/hr at 05/02/21 0821 75 mL/hr at 05/02/21 0821  . acetaminophen (TYLENOL) tablet 325-650 mg  325-650 mg Oral Q6H PRN 07/02/21, MD      . alum & mag hydroxide-simeth (MAALOX/MYLANTA) 200-200-20 MG/5ML suspension 30 mL  30 mL Oral Q4H PRN 06-08-2001, MD      . amLODipine (NORVASC) tablet 10 mg  10 mg Oral Daily Juanell Fairly, MD   10 mg at 05/02/21 0814  . bisacodyl (DULCOLAX) suppository 10 mg  10 mg Rectal Daily PRN 07/02/21, MD      . Chlorhexidine Gluconate Cloth 2 % PADS 6 each  6 each Topical Daily Juanell Fairly, MD   6 each at 05/02/21 1000  . docusate sodium (COLACE) capsule 100 mg  100 mg Oral BID 07/02/21, MD   100 mg at 05/02/21 0813  . enoxaparin (LOVENOX) injection 30 mg  30 mg Subcutaneous Q24H 07/02/21, MD   30 mg at 05/02/21 0815  . famotidine (PEPCID) tablet 20 mg  20 mg Oral Daily 07/02/21, MD   20 mg at 05/02/21 0813  . HYDROcodone-acetaminophen (NORCO/VICODIN) 5-325 MG per tablet 1-2  tablet  1-2 tablet Oral Q4H PRN Juanell Fairly, MD   1 tablet at 05/01/21 1806  . magnesium citrate solution 1 Bottle  1 Bottle Oral Once PRN Juanell Fairly, MD      . menthol-cetylpyridinium (CEPACOL) lozenge 3 mg  1 lozenge Oral PRN Juanell Fairly, MD       Or  . phenol (CHLORASEPTIC) mouth spray 1 spray  1 spray Mouth/Throat PRN Juanell Fairly, MD      . methocarbamol (ROBAXIN) tablet 500 mg  500 mg Oral Q6H PRN Juanell Fairly, MD       Or  . methocarbamol (ROBAXIN) 500 mg in dextrose 5 % 50 mL IVPB  500 mg Intravenous Q6H PRN Juanell Fairly, MD      . metoprolol succinate (TOPROL-XL) 24 hr tablet 50 mg  50 mg Oral Daily Juanell Fairly, MD   50 mg at 05/02/21 0814  . mirtazapine (REMERON) tablet 7.5 mg  7.5 mg Oral QHS Juanell Fairly, MD   7.5 mg at 05/01/21 2321  . morphine  2 MG/ML injection 0.5-1 mg  0.5-1 mg Intravenous Q2H PRN Juanell Fairly, MD   1 mg at 05/01/21 1708  . multivitamin with minerals tablet 1 tablet  1 tablet Oral Daily Juanell Fairly, MD   1 tablet at 05/02/21 0813  . ondansetron (ZOFRAN) tablet 4 mg  4 mg Oral Q6H PRN Juanell Fairly, MD       Or  . ondansetron [facility]) injection 4 mg  4 mg Intravenous Q6H PRN Juanell Fairly, MD      . pantoprazole (PROTONIX) EC tablet 40 mg  40 mg Oral Daily Juanell Fairly, MD   40 mg at 05/02/21 0813  . polyethylene glycol (MIRALAX / GLYCOLAX) packet 17 g  17 g Oral Daily Juanell Fairly, MD   17 g at 05/02/21 0814  . polyethylene glycol (MIRALAX / GLYCOLAX) packet 17 g  17 g Oral Daily PRN Juanell Fairly, MD      . senna (SENOKOT) tablet 8.6 mg  1 tablet Oral BID Juanell Fairly, MD   8.6 mg at 05/02/21 0813  . traMADol (ULTRAM) tablet 50 mg  50 mg Oral Q6H Juanell Fairly, MD   50 mg at 05/02/21 1221     Discharge Medications: Please see discharge summary for a list of discharge medications.  Relevant Imaging Results:  Relevant Lab Results:   Additional Information SS# [id number]  Barrie Dunker, RN

## 2021-05-02 NOTE — Progress Notes (Signed)
Physical Therapy Treatment Patient Details Name: Kimberly Stokes MRN: [id number] DOB: January 11, 1921 Today's Date: 05/02/2021    History of Present Illness presented to ER secondary to mechanical fall in home environment; admitted for management of L intertrochanteric hip fracture, s/p IM nailing (05/11/21), WBAT.    PT Comments    Pt was asleep in recliner with supportive daughter at bedside. She easily awakes and agrees to PT session. Requesting to return to bed. Does endorse pain that quickly elevated with movements. Pt was able to stand with less assistance this afternoon versus this morning. Does require vcs for hand placement and fwd wt shift. Ambulate ~ 5 ft and was able to take steps to return to EOB. Max assist to repositioned back into supine position in bed. Pt severely limited by pain but was cooperative and gives great effort throughout. Highly recommend DC to SNF to assist pt with returning to PLOF.    Follow Up Recommendations  SNF     Equipment Recommendations  Rolling [place] with 5" wheels;3in1 (PT)       Precautions / Restrictions Precautions Precautions: Fall Restrictions Weight Bearing Restrictions: Yes LLE Weight Bearing: Weight bearing as tolerated    Mobility  Bed Mobility Overal bed mobility: Needs Assistance Bed Mobility: Sit to Supine       Sit to supine: Max assist   General bed mobility comments: Max assist of one to progress form shortsit to supine.    Transfers Overall transfer level: Needs assistance Equipment used: Rolling [place] (2 wheeled) Transfers: Sit to/from Stand Sit to Stand: Min assist;Mod assist         General transfer comment: Pt stood from recliner with min-mod assist of one. Vcs for handplacement, fwd wt shift, and overall improve technique.  Ambulation/Gait Ambulation/Gait assistance: Mod assist Gait Distance (Feet): 5 Feet Assistive device: Rolling [place] (2 wheeled) Gait Pattern/deviations: Antalgic;Step-to pattern Gait  velocity: decreased   General Gait Details: pt was able to ambulate ~ 5 ft with antalgice step to pattern. no LOB but does have slight buckling of LLE during RLE advancement       Balance Overall balance assessment: Needs assistance Sitting-balance support: No upper extremity supported;Feet supported Sitting balance-Leahy Scale: Good     Standing balance support: Bilateral upper extremity supported Standing balance-Leahy Scale: Fair Standing balance comment: heavy reliance on RW for support         Cognition Arousal/Alertness: Awake/alert Behavior During Therapy: WFL for tasks assessed/performed Overall Cognitive Status: Within Functional Limits for tasks assessed      General Comments: Pt is lethargic but easily awakes and is cooperative throughout. limited by pain             Pertinent Vitals/Pain Pain Assessment: Faces Faces Pain Scale: Hurts even more Pain Location: L hip Pain Descriptors / Indicators: Aching;Grimacing;Guarding Pain Intervention(s): Limited activity within patient's tolerance;Monitored during session;Repositioned           PT Goals (current goals can now be found in the care plan section) Acute Rehab PT Goals Patient Stated Goal: get better so I can go home Progress towards PT goals: Progressing toward goals    Frequency    BID      PT Plan Current plan remains appropriate       AM-PAC PT "6 Clicks" Mobility   Outcome Measure  Help needed turning from your back to your side while in a flat bed without using bedrails?: A Lot Help needed moving from lying on your back to sitting on  the side of a flat bed without using bedrails?: A Lot Help needed moving to and from a bed to a chair (including a wheelchair)?: A Lot Help needed standing up from a chair using your arms (e.g., wheelchair or bedside chair)?: A Lot Help needed to walk in hospital room?: A Lot Help needed climbing 3-5 steps with a railing? : A Lot 6 Click Score: 12     End of Session Equipment Utilized During Treatment: Gait belt Activity Tolerance: Patient tolerated treatment well Patient left: in bed;with call bell/phone within reach;with bed alarm set;with family/visitor present;with SCD's reapplied Nurse Communication: Mobility status PT Visit Diagnosis: Muscle weakness (generalized) (M62.81);Difficulty in walking, not elsewhere classified (R26.2);Pain Pain - Right/Left: Left Pain - part of body: Hip     Time: 9678-9381 PT Time Calculation (min) (ACUTE ONLY): 18 min  Charges:  $Therapeutic Activity: 8-22 mins                     Jetta Lout PTA 05/02/21, 3:19 PM

## 2021-05-02 NOTE — Evaluation (Signed)
Occupational Therapy Evaluation Patient Details Name: Kimberly Stokes MRN: [id number] DOB: 22-Apr-1921 Today's Date: 05/02/2021    History of Present Illness presented to ER secondary to mechanical fall in home environment; admitted for management of L intertrochanteric hip fracture, s/p IM nailing (05/11/21), WBAT.   Clinical Impression   Pt seen for OT evaluation this date in setting of acute hospitalization d/t L hip fx s/p IM nail. Pt's daughter present and provides most background information. Reports pt is INDEP with basic self care and household IADLs with exception of running errands/driving and paying bills which daughter reports that she helps with. Pt amb in the home with intermittent use of SPC. Pt presents this date with decreased fxl activity tolerance and post-op L hip pain superimposed on baseline cognitive decline, impacting her ability to safely and efficiently perform ADLs/ADL mobility. On ADL assessment this date, pt requires SETUP to INDEP with seated UB ADLs, MOD/MAX A for seated LB ADLs 2/2 pain. Pt requires MOD A to come to sitting and MAX A to return to supine. Pt left in bed with all needs met and in reach. OT educates pt and OT re: role and potential post-op needs to anticipate including potential use of AE. Pt with MIN/MOD reception. Pt's daughter with good understanding. Anticipate she will require STR in SNF setting to improve strength and safety with ADLs prior to returning to her home environment.     Follow Up Recommendations  SNF    Equipment Recommendations  3 in 1 bedside commode;Tub/shower seat;Other (comment) (2ww)    Recommendations for Other Services       Precautions / Restrictions Precautions Precautions: Fall Restrictions Weight Bearing Restrictions: Yes LLE Weight Bearing: Weight bearing as tolerated      Mobility Bed Mobility Overal bed mobility: Needs Assistance Bed Mobility: Sit to Supine;Supine to Sit     Supine to sit: Mod assist Sit to  supine: Max assist   General bed mobility comments: MAX A for back to bed 2/2 pain    Transfers Overall transfer level: Needs assistance Equipment used: Rolling [place] (2 wheeled) Transfers: Sit to/from Stand Sit to Stand: Min assist;Mod assist         General transfer comment: deferred, pt requesting pain meds just with coming to sitting and grimmacing/guarding op site.    Balance Overall balance assessment: Needs assistance Sitting-balance support: No upper extremity supported;Feet supported Sitting balance-Leahy Scale: Good     Standing balance support: Bilateral upper extremity supported Standing balance-Leahy Scale: Fair Standing balance comment: deferred                           ADL either performed or assessed with clinical judgement   ADL Overall ADL's : Needs assistance/impaired                                       General ADL Comments: SETUP to INDEP with seated UB ADLs, MOD/MAX A for seated LB ADLs 2/2 pain.     Vision Patient Visual Report: No change from baseline       Perception     Praxis      Pertinent Vitals/Pain Pain Assessment: Faces Faces Pain Scale: Hurts even more Pain Location: L hip only with mobilization, pt is otherwise resting comfortably. Pain Descriptors / Indicators: Aching;Grimacing;Guarding Pain Intervention(s): Limited activity within patient's tolerance;Monitored during session;Repositioned;Patient requesting pain meds-RN  notified     Hand Dominance     Extremity/Trunk Assessment Upper Extremity Assessment Upper Extremity Assessment: Overall WFL for tasks assessed;Generalized weakness (ROM WFL, MMT grossly 4-/5)   Lower Extremity Assessment Lower Extremity Assessment: Generalized weakness;LLE deficits/detail LLE Deficits / Details: difficult to formally assess d/t pain       Communication Communication Communication: No difficulties   Cognition Arousal/Alertness: Awake/alert Behavior  During Therapy: WFL for tasks assessed/performed Overall Cognitive Status: History of cognitive impairments - at baseline                                 General Comments: Pt is somewhat drowsy, but appropriate with all commands. Oriented to self and some aspects of situation, but not time or location.   General Comments       Exercises Other Exercises Other Exercises: OT engages pt in seated LB ADL trials with light education with pt and daughter re: potential need for use of equipment should pt appear appropriate with learning new concepts or could potentially benefit from written/picture directions to use.   Shoulder Instructions      Home Living Family/patient expects to be discharged to:: Private residence Living Arrangements: Alone Available Help at Discharge: Family;Available PRN/intermittently (daughter reports checking on her every day) Type of Home: House Home Access: Ramped entrance     Home Layout: One level               Home Equipment: [place] - single point          Prior Functioning/Environment Level of Independence: Needs assistance  Gait / Transfers Assistance Needed: Mod indep with ADLs, household mobilization without assist device in the home, SPC in the community ADL's / Homemaking Assistance Needed: Pt able to perform [facility] IADLs such as cooking/cleaning and all her basic self care. Pt's daughter assists with running errands and paying bills. States pt does cook for herself and so far, there have been no episodes of leaving burner/oven on.   Comments: Pt's daughter reports that fall before this hospitalization is only recent fall in memory for years.        OT Problem List: Decreased strength;Decreased range of motion;Decreased activity tolerance;Pain      OT Treatment/Interventions: Self-care/ADL training;DME and/or AE instruction;Therapeutic activities;Balance training;Therapeutic exercise;Patient/family education    OT Goals(Current  goals can be found in the care plan section) Acute Rehab OT Goals Patient Stated Goal: get better so I can go home OT Goal Formulation: With patient/family Time For Goal Achievement: 05/16/21 Potential to Achieve Goals: Good ADL Goals Pt Will Perform Lower Body Dressing: with min assist;sitting/lateral leans (with adaptive technique to reduce post-op pain with LB dressing/use of reacher if appropriate/pt able to absorb new learning.) Pt Will Transfer to Toilet: with min assist;stand pivot transfer;bedside commode Pt Will Perform Toileting - Clothing Manipulation and hygiene: with min assist;sit to/from stand Pt Will Perform Tub/Shower Transfer: with min assist;ambulating;shower seat;grab bars;rolling [place]  OT Frequency: Min 2X/week   Barriers to D/C:            Co-evaluation              AM-PAC OT "6 Clicks" Daily Activity     Outcome Measure Help from another person eating meals?: None Help from another person taking care of personal grooming?: A Little Help from another person toileting, which includes using toliet, bedpan, or urinal?: A Lot Help from another person bathing (including washing,  rinsing, drying)?: A Lot Help from another person to put on and taking off regular upper body clothing?: A Little Help from another person to put on and taking off regular lower body clothing?: A Lot 6 Click Score: 16   End of Session Nurse Communication: Mobility status;Patient requests pain meds  Activity Tolerance: Patient limited by pain Patient left: in bed;with call bell/phone within reach;with bed alarm set;with family/visitor present  OT Visit Diagnosis: Unsteadiness on feet (R26.81);Muscle weakness (generalized) (M62.81)                Time: 9702-6378 OT Time Calculation (min): 31 min Charges:  OT General Charges $OT Visit: 1 Visit OT Evaluation $OT Eval Moderate Complexity: 1 Mod OT Treatments $Self Care/Home Management : 8-22 mins  Gerrianne Scale, MS, OTR/L ascom  [phone] 05/02/21, 6:45 PM

## 2021-05-02 NOTE — Evaluation (Signed)
Physical Therapy Evaluation Patient Details Name: Kimberly Stokes MRN: [id number] DOB: 1921-11-21 Today's Date: 05/02/2021   History of Present Illness  presented to ER secondary to mechanical fall in home environment; admitted for management of L intertrochanteric hip fracture, s/p IM nailing (05/11/21), WBAT.  Clinical Impression  Patient resting in bed upon arrival to session; alert and oriented to basic information, follows commands, pleasant and cooperative throughout session.  L LE pain 6/10 per FACES scales; meds received prior to session.  Fair post-op strength (2+ to 3-/5) and ROM to L hip, generally limited by pain but grossly functional for basic transfers and gait.  Currently requiring mod assist for bed mobility; mod assist for sit/stand, basic transfers and gait (4') with RW, mod assist.  Demonstrates 3-point, step to gait pattern; poor tolerance for L LE WBing, maintains in L hip/knee flexion throughout gait cycle.  Excessive trunk flexion at times (lurches forward) due to pain with L hip WBing.  Poor ability to unweight/advance R LE due to limited weight acceptance L LE; tends to slide across floor versus lift/step.  Additional distance/activity limited by pain.  Will continue to assess/progress as medically appropriate. Would benefit from skilled PT to address above deficits and promote optimal return to PLOF.; recommend transition to STR upon discharge from acute hospitalization.     Follow Up Recommendations SNF    Equipment Recommendations  Rolling [place] with 5" wheels;3in1 (PT)    Recommendations for Other Services       Precautions / Restrictions Precautions Precautions: Fall Restrictions Weight Bearing Restrictions: Yes LLE Weight Bearing: Weight bearing as tolerated      Mobility  Bed Mobility Overal bed mobility: Needs Assistance Bed Mobility: Supine to Sit     Supine to sit: Mod assist     General bed mobility comments: assist for LE management     Transfers Overall transfer level: Needs assistance Equipment used: Rolling [place] (2 wheeled) Transfers: Sit to/from Stand Sit to Stand: Mod assist         General transfer comment: step by step cuing for hand/foot placement  Ambulation/Gait Ambulation/Gait assistance: Mod assist;+2 safety/equipment Gait Distance (Feet): 4 Feet Assistive device: Rolling Tosh (2 wheeled)       General Gait Details: 3-point, step to gait pattern; poor tolerance for L LE WBing, maintains in L hip/knee flexion throughout gait cycle.  Excessive trunk flexion at times (lurches forward) due to pain with L hip WBing.  Poor ability to unweight/advance R LE due to limited weight acceptance L LE; tends to slide across floor versus lift/step.  Stairs            Wheelchair Mobility    Modified Rankin (Stroke Patients Only)       Balance Overall balance assessment: Needs assistance Sitting-balance support: No upper extremity supported;Feet supported Sitting balance-Leahy Scale: Good     Standing balance support: Bilateral upper extremity supported Standing balance-Leahy Scale: Poor                               Pertinent Vitals/Pain Pain Assessment: Faces Faces Pain Scale: Hurts even more Pain Location: L hip Pain Descriptors / Indicators: Aching;Grimacing;Guarding Pain Intervention(s): Limited activity within patient's tolerance;Monitored during session;Repositioned    Home Living Family/patient expects to be discharged to:: Private residence Living Arrangements: Alone Available Help at Discharge: Family;Available PRN/intermittently Type of Home: House Home Access: Ramped entrance     Home Layout: One level Home Equipment:  Cane - single point      Prior Function Level of Independence: Independent with assistive device(s)         Comments: Mod indep with ADLs, household mobilization without assist device in the home, SPC in the community; daughter assists with  errands/driving needs.  Denies additional fall history outside of this event.     Hand Dominance        Extremity/Trunk Assessment   Upper Extremity Assessment Upper Extremity Assessment: Overall WFL for tasks assessed    Lower Extremity Assessment Lower Extremity Assessment: Generalized weakness (L hip grossly 2+ to 3-/5, limited by pain; R LE grossly 3+ to 4-/5)       Communication   Communication: No difficulties  Cognition Arousal/Alertness: Awake/alert Behavior During Therapy: WFL for tasks assessed/performed Overall Cognitive Status: Within Functional Limits for tasks assessed                                        General Comments      Exercises Other Exercises Other Exercises: Supine L LE therex, 1x10, act assist ROM: ankle pumps, quad sets, SAQs, heel slides.  Hip ROM generally limited by pain, but grossly functional for basic transfers and gait.   Assessment/Plan    PT Assessment Patient needs continued PT services  PT Problem List Decreased strength;Decreased range of motion;Decreased activity tolerance;Decreased balance;Decreased mobility;Decreased coordination;Decreased cognition;Decreased knowledge of use of DME;Decreased safety awareness;Decreased knowledge of precautions;Decreased skin integrity;Pain       PT Treatment Interventions DME instruction;Gait training;Stair training;Functional mobility training;Therapeutic activities;Therapeutic exercise;Balance training;Patient/family education    PT Goals (Current goals can be found in the Care Plan section)  Acute Rehab PT Goals Patient Stated Goal: to work with therapy PT Goal Formulation: With patient Time For Goal Achievement: 05/16/21 Potential to Achieve Goals: Good    Frequency BID   Barriers to discharge Decreased caregiver support      Co-evaluation               AM-PAC PT "6 Clicks" Mobility  Outcome Measure Help needed turning from your back to your side while in  a flat bed without using bedrails?: A Lot Help needed moving from lying on your back to sitting on the side of a flat bed without using bedrails?: A Lot Help needed moving to and from a bed to a chair (including a wheelchair)?: A Lot Help needed standing up from a chair using your arms (e.g., wheelchair or bedside chair)?: A Lot Help needed to walk in hospital room?: A Lot Help needed climbing 3-5 steps with a railing? : Total 6 Click Score: 11    End of Session Equipment Utilized During Treatment: Gait belt Activity Tolerance: Patient tolerated treatment well Patient left: in chair;with call bell/phone within reach;with chair alarm set Nurse Communication: Mobility status PT Visit Diagnosis: Muscle weakness (generalized) (M62.81);Difficulty in walking, not elsewhere classified (R26.2);Pain Pain - Right/Left: Left Pain - part of body: Hip    Time: 0973-5329 PT Time Calculation (min) (ACUTE ONLY): 18 min   Charges:   PT Evaluation $PT Eval Moderate Complexity: 1 Mod PT Treatments $Therapeutic Exercise: 8-22 mins        Tevion Laforge H. Manson Passey, PT, DPT, NCS 05/02/21, 10:45 AM [phone]

## 2021-05-02 NOTE — TOC Progression Note (Signed)
Transition of Care [facility]) - Progression Note    Patient Details  Name: Kimberly Stokes MRN: [id number] Date of Birth: December 07, 1920  Transition of Care [facility]) CM/SW Contact  [place], RN Phone Number: 05/02/2021, 3:50 PM  Clinical Narrative:    Fl2 and PASSR completed-PASSR number [id number] A. Bed search started. Will need insurance auth once bed offered and selected by patient/family.         Expected Discharge Plan and Services                                                 Social Determinants of Health (SDOH) Interventions    Readmission Risk Interventions No flowsheet data found.

## 2021-05-02 NOTE — TOC Progression Note (Signed)
Transition of Care [facility]) - Progression Note    Patient Details  Name: Kimberly Stokes MRN: [id number] Date of Birth: November 05, 1921  Transition of Care [facility]) CM/SW [place], RN Phone Number: 05/02/2021, 3:56 PM  Clinical Narrative:     Met with the patient in the room to discuss DC plan and needs She is in the room with her daughter  She agrees to go to a facility for STR She has had all vaccines and is boosted as well She has a PASSR [id number] A Fl2 completed and Bedsearch sent, will review the bed offers once obtained      Expected Discharge Plan and Services                                                 Social Determinants of Health (SDOH) Interventions    Readmission Risk Interventions No flowsheet data found.

## 2021-05-02 NOTE — Progress Notes (Signed)
PROGRESS NOTE    Kimberly Stokes  FXT:[id number] DOB: [birth year] DOA: 04/30/2021 PCP: Hillery Aldo, MD   Assessment & Plan:   Principal Problem:   Femur fracture, left (HCC) Active Problems:   Hypertension   Arthritis   CKD (chronic kidney disease) stage 3, GFR 30-59 ml/min (HCC)   Hypercalcemia   Left femur fracture: secondary to fall at home. Norco, morphine prn. S/p intramedullary fixation for left intertrochanteric hip fracture on 05/01/21 as per ortho surg. PT/OT recs SNF   HTN: continue on BB, CCB   CKDIIIa: Cr is labile but still w/in baseline range   Likely ACD: likely secondary to CKD w/ component of acute blood loss anemia from surgery stated above. No need for a transfusion currently   Thrombocytopenia: etiology unclear. Will continue to monitor   Hypercalcemia: resolved  Hyperglycemia: no hx of DM. Will continue to monitor   DVT prophylaxis: SCDs Code Status: full  Family Communication: called pt's daughter, [name], but she was not available Disposition Plan: depends on PT/OT recs  Level of care: Med-Surg   Status is: Inpatient  Remains inpatient appropriate because:Ongoing diagnostic testing needed not appropriate for outpatient work up, Unsafe d/c plan, IV treatments appropriate due to intensity of illness or inability to take PO and Inpatient level of care appropriate due to severity of illness   Dispo: The patient is from: Home              Anticipated d/c is to: SNF              Patient currently is not medically stable to d/c.   Difficult to place patient : unclear    Consultants:   Ortho surg    Procedures:    Antimicrobials:    Subjective: Pt denies any pain. Pt c/o fatigue   Objective: Vitals:   05/01/21 1544 05/01/21 1812 05/01/21 2040 05/02/21 0500  BP: 135/75 (!) 142/77 110/72 114/61  Pulse: 76 91 75 63  Resp: 20 16 16 16   Temp: 97.6 F (36.4 C) 98.8 F (37.1 C) 98.6 F (37 C) 99.3 F (37.4 C)  TempSrc: Oral Oral Oral  Oral  SpO2: 100% 100% 98% 98%  Weight:      Height:        Intake/Output Summary (Last 24 hours) at 05/02/2021 0752 Last data filed at 05/02/2021 0230 Gross per 24 hour  Intake 1423.82 ml  Output 825 ml  Net 598.82 ml   Filed Weights   04/30/21 1238 05/01/21 1055  Weight: 50 kg 50 kg    Examination:  General exam: Appears comfortable. Frail appearing   Respiratory system: clear breath sounds b/l Cardiovascular system: S1/S2+. No clicks or gallops  Gastrointestinal system: Abd is soft, NT, ND & hypoactive bowel sounds  Central nervous system:  Alert and oriented. Moves all extremities  Psychiatry: judgement and insight appear normal. Appropriate mood and affect      Data Reviewed: I have personally reviewed following labs and imaging studies  CBC: Recent Labs  Lab 04/30/21 1236 05/01/21 0610 05/02/21 0623  WBC 5.2 7.9 7.9  NEUTROABS 3.5  --   --   HGB 10.9* 10.3* 7.8*  HCT 34.0* 32.5* 24.1*  MCV 89.0 87.4 86.7  PLT 199 189 144*   Basic Metabolic Panel: Recent Labs  Lab 04/30/21 1236 05/01/21 0610 05/02/21 0623  NA 142 139 135  K 3.9 4.0 4.0  CL 107 103 101  CO2 28 29 27   GLUCOSE 89 110* 103*  BUN  31* 24* 25*  CREATININE 1.19* 0.92 1.05*  CALCIUM 11.1* 10.6* 9.6   GFR: Estimated Creatinine Clearance: 24.2 mL/min (A) (by C-G formula based on SCr of 1.05 mg/dL (H)). Liver Function Tests: Recent Labs  Lab 04/30/21 1236  AST 24  ALT 12  ALKPHOS 44  BILITOT 0.5  PROT 6.1*  ALBUMIN 3.6   No results for input(s): LIPASE, AMYLASE in the last 168 hours. No results for input(s): AMMONIA in the last 168 hours. Coagulation Profile: No results for input(s): INR, PROTIME in the last 168 hours. Cardiac Enzymes: No results for input(s): CKTOTAL, CKMB, CKMBINDEX, TROPONINI in the last 168 hours. BNP (last 3 results) No results for input(s): PROBNP in the last 8760 hours. HbA1C: No results for input(s): HGBA1C in the last 72 hours. CBG: No results for  input(s): GLUCAP in the last 168 hours. Lipid Profile: No results for input(s): CHOL, HDL, LDLCALC, TRIG, CHOLHDL, LDLDIRECT in the last 72 hours. Thyroid Function Tests: No results for input(s): TSH, T4TOTAL, FREET4, T3FREE, THYROIDAB in the last 72 hours. Anemia Panel: No results for input(s): VITAMINB12, FOLATE, FERRITIN, TIBC, IRON, RETICCTPCT in the last 72 hours. Sepsis Labs: No results for input(s): PROCALCITON, LATICACIDVEN in the last 168 hours.  Recent Results (from the past 240 hour(s))  Resp Panel by RT-PCR (Flu A&B, Covid) Nasopharyngeal Swab     Status: None   Collection Time: 04/30/21  1:59 PM   Specimen: Nasopharyngeal Swab; Nasopharyngeal(NP) swabs in vial transport medium  Result Value Ref Range Status   SARS Coronavirus 2 by RT PCR NEGATIVE NEGATIVE Final    Comment: (NOTE) SARS-CoV-2 target nucleic acids are NOT DETECTED.  The SARS-CoV-2 RNA is generally detectable in upper respiratory specimens during the acute phase of infection. The lowest concentration of SARS-CoV-2 viral copies this assay can detect is 138 copies/mL. A negative result does not preclude SARS-Cov-2 infection and should not be used as the sole basis for treatment or other patient management decisions. A negative result may occur with  improper specimen collection/handling, submission of specimen other than nasopharyngeal swab, presence of viral mutation(s) within the areas targeted by this assay, and inadequate number of viral copies(<138 copies/mL). A negative result must be combined with clinical observations, patient history, and epidemiological information. The expected result is Negative.  Fact Sheet for Patients:  BloggerCourse.com  Fact Sheet for Healthcare Providers:  SeriousBroker.it  This test is no t yet approved or cleared by the [place] FDA and  has been authorized for detection and/or diagnosis of SARS-CoV-2 by FDA under  an Emergency Use Authorization (EUA). This EUA will remain  in effect (meaning this test can be used) for the duration of the COVID-19 declaration under Section 564(b)(1) of the Act, 21 U.S.C.section 360bbb-3(b)(1), unless the authorization is terminated  or revoked sooner.       Influenza A by PCR NEGATIVE NEGATIVE Final   Influenza B by PCR NEGATIVE NEGATIVE Final    Comment: (NOTE) The Xpert Xpress SARS-CoV-2/FLU/RSV plus assay is intended as an aid in the diagnosis of influenza from Nasopharyngeal swab specimens and should not be used as a sole basis for treatment. Nasal washings and aspirates are unacceptable for Xpert Xpress SARS-CoV-2/FLU/RSV testing.  Fact Sheet for Patients: BloggerCourse.com  Fact Sheet for Healthcare Providers: SeriousBroker.it  This test is not yet approved or cleared by the [place] FDA and has been authorized for detection and/or diagnosis of SARS-CoV-2 by FDA under an Emergency Use Authorization (EUA). This EUA will remain in effect (  meaning this test can be used) for the duration of the COVID-19 declaration under Section 564(b)(1) of the Act, 21 U.S.C. section 360bbb-3(b)(1), unless the authorization is terminated or revoked.  Performed at [facility], [address] [place], [place] [address]          Radiology Studies: DG Chest 1 View  Result Date: 04/30/2021 CLINICAL DATA:  Fall EXAM: CHEST  1 VIEW COMPARISON:  2019 FINDINGS: The heart size and mediastinal contours are within normal limits. Both lungs are clear. No pleural effusion. No pneumothorax. The visualized skeletal structures are intact. IMPRESSION: No acute process in the chest Electronically Signed   By: Guadlupe SpanishPraneil  Patel M.D.   On: 04/30/2021 14:01   CT Head Wo Contrast  Result Date: 04/30/2021 CLINICAL DATA:  Head and neck trauma. EXAM: CT HEAD WITHOUT CONTRAST CT CERVICAL SPINE WITHOUT CONTRAST TECHNIQUE:  Multidetector CT imaging of the head and cervical spine was performed following the standard protocol without intravenous contrast. Multiplanar CT image reconstructions of the cervical spine were also generated. COMPARISON:  None. FINDINGS: CT HEAD FINDINGS Brain: Generalized mild atrophy. Negative for acute infarct, hemorrhage, mass. Mild white matter hypodensity bilaterally. Vascular: Negative for hyperdense vessel Skull: Negative Sinuses/Orbits: Mucosal edema and bony thickening right ethmoid sinus. Remaining sinuses clear. Bilateral cataract extraction. Other: None CT CERVICAL SPINE FINDINGS Alignment: Normal Skull base and vertebrae: Negative for fracture Soft tissues and spinal canal: Negative Disc levels: Multilevel disc degeneration. Disc space narrowing and disc calcification is present at multiple levels. Bilateral facet degeneration is present. No significant spinal stenosis. Upper chest: Lung apices clear bilaterally Other: None IMPRESSION: 1. No acute intracranial abnormality 2. Negative for cervical fracture. Electronically Signed   By: Marlan Palauharles  Clark M.D.   On: 04/30/2021 13:36   CT Cervical Spine Wo Contrast  Result Date: 04/30/2021 CLINICAL DATA:  Head and neck trauma. EXAM: CT HEAD WITHOUT CONTRAST CT CERVICAL SPINE WITHOUT CONTRAST TECHNIQUE: Multidetector CT imaging of the head and cervical spine was performed following the standard protocol without intravenous contrast. Multiplanar CT image reconstructions of the cervical spine were also generated. COMPARISON:  None. FINDINGS: CT HEAD FINDINGS Brain: Generalized mild atrophy. Negative for acute infarct, hemorrhage, mass. Mild white matter hypodensity bilaterally. Vascular: Negative for hyperdense vessel Skull: Negative Sinuses/Orbits: Mucosal edema and bony thickening right ethmoid sinus. Remaining sinuses clear. Bilateral cataract extraction. Other: None CT CERVICAL SPINE FINDINGS Alignment: Normal Skull base and vertebrae: Negative for  fracture Soft tissues and spinal canal: Negative Disc levels: Multilevel disc degeneration. Disc space narrowing and disc calcification is present at multiple levels. Bilateral facet degeneration is present. No significant spinal stenosis. Upper chest: Lung apices clear bilaterally Other: None IMPRESSION: 1. No acute intracranial abnormality 2. Negative for cervical fracture. Electronically Signed   By: Marlan Palauharles  Clark M.D.   On: 04/30/2021 13:36   DG HIP OPERATIVE UNILAT W OR W/O PELVIS LEFT  Result Date: 05/01/2021 CLINICAL DATA:  Left femur nail for fracture EXAM: OPERATIVE left HIP (WITH PELVIS IF PERFORMED) 4 VIEWS TECHNIQUE: Fluoroscopic spot image(s) were submitted for interpretation post-operatively. COMPARISON:  04/30/2021 FINDINGS: Left intertrochanteric fracture is been fixed with a compression screw and locking intramedullary nail extending into the distal left femur. Fracture alignment is satisfactory. Hardware position satisfactory IMPRESSION: Satisfactory ORIF left intertrochanteric fracture. Electronically Signed   By: Marlan Palauharles  Clark M.D.   On: 05/01/2021 14:12   DG Hip Unilat W or Wo Pelvis 2-3 Views Left  Result Date: 04/30/2021 CLINICAL DATA:  Fall. EXAM: DG HIP (  WITH OR WITHOUT PELVIS) 2-3V LEFT COMPARISON:  CT 06/27/2018. FINDINGS: Slightly angulated left intertrochanteric hip fracture. No evidence of dislocation. Osteopenia. Degenerative changes lumbar spine and both hips. Peripheral vascular calcification. Pelvic calcifications consistent phleboliths. IMPRESSION: 1.  Slightly angulated left intertrochanteric hip fracture. 2. Diffuse osteopenia. Degenerative change lumbar spine and both hips. 3.  Peripheral vascular disease. Electronically Signed   By: Maisie Fus  Register   On: 04/30/2021 13:54   DG FEMUR PORT MIN 2 VIEWS LEFT  Result Date: 05/01/2021 CLINICAL DATA:  Post LEFT femoral nailing EXAM: LEFT FEMUR PORTABLE 2 VIEWS COMPARISON:  Portable exam 1457 hours compared to intraoperative  images of 05/01/2021 FINDINGS: IM nail with compression screw at proximal LEFT femur across a reduced intertrochanteric fracture. Bones appear demineralized. No dislocation. Degenerative changes and chondrocalcinosis at LEFT knee. IMPRESSION: Post nailing of LEFT femoral fracture. Electronically Signed   By: Ulyses Southward M.D.   On: 05/01/2021 15:19        Scheduled Meds: . (feeding supplement) PROSource Plus  30 mL Oral BID BM  . acetaminophen  500 mg Oral Q6H  . amLODipine  10 mg Oral Daily  . Chlorhexidine Gluconate Cloth  6 each Topical Daily  . docusate sodium  100 mg Oral BID  . enoxaparin (LOVENOX) injection  30 mg Subcutaneous Q24H  . famotidine  20 mg Oral Daily  . metoprolol succinate  50 mg Oral Daily  . mirtazapine  7.5 mg Oral QHS  . multivitamin with minerals  1 tablet Oral Daily  . pantoprazole  40 mg Oral Daily  . polyethylene glycol  17 g Oral Daily  . senna  1 tablet Oral BID  . traMADol  50 mg Oral Q6H   Continuous Infusions: . sodium chloride Stopped (05/02/21 0240)  . methocarbamol (ROBAXIN) IV       LOS: 2 days    Time spent: 30 mins     Charise Killian, MD [facility] Pager [phone]  If 7PM-7AM, please contact night-coverage 05/02/2021, 7:52 AM

## 2021-05-02 NOTE — NC FL2 (Signed)
[place] MEDICAID FL2 LEVEL OF CARE SCREENING TOOL     IDENTIFICATION  Patient Name: Kimberly Stokes Birthdate: 1921/07/12 Sex: female Admission Date (Current Location): 04/30/2021  [facility] and [place] Number:  Chiropodist and Address:  [facility], [address] [place], [place] [address]      Provider Number: [id number]  Attending Physician Name and Address:  Charise Killian, MD  Relative Name and Phone Number:       Current Level of Care: Hospital Recommended Level of Care: Skilled Nursing Facility Prior Approval Number:    Date Approved/Denied:   PASRR Number:    Discharge Plan: SNF    Current Diagnoses: Patient Active Problem List   Diagnosis Date Noted  . Femur fracture, left (HCC) 04/30/2021  . Hypertension   . Arthritis   . CKD (chronic kidney disease) stage 3, GFR 30-59 ml/min (HCC)   . Hypercalcemia     Orientation RESPIRATION BLADDER Height & Weight     Self,Time,Situation,Place  Normal Continent Weight: 50 kg Height:  5\' 2"  (157.5 cm)  BEHAVIORAL SYMPTOMS/MOOD NEUROLOGICAL BOWEL NUTRITION STATUS      Continent Diet  AMBULATORY STATUS COMMUNICATION OF NEEDS Skin   Extensive Assist Verbally Normal                       Personal Care Assistance Level of Assistance  Bathing,Dressing Bathing Assistance: Limited assistance   Dressing Assistance: Limited assistance     Functional Limitations Info             SPECIAL CARE FACTORS FREQUENCY  PT (By licensed PT),OT (By licensed OT)     PT Frequency: min 5xweek OT Frequency: min 5xweek            Contractures      Additional Factors Info                  Current Medications (05/02/2021):  This is the current hospital active medication list Current Facility-Administered Medications  Medication Dose Route Frequency Provider Last Rate Last Admin  . (feeding supplement) PROSource Plus liquid 30 mL  30 mL Oral BID BM 07/02/2021, MD    30 mL at 05/02/21 1400  . 0.9 %  sodium chloride infusion  75 mL/hr Intravenous Continuous 07/02/21, MD 75 mL/hr at 05/02/21 0821 75 mL/hr at 05/02/21 0821  . acetaminophen (TYLENOL) tablet 325-650 mg  325-650 mg Oral Q6H PRN 07/02/21, MD      . alum & mag hydroxide-simeth (MAALOX/MYLANTA) 200-200-20 MG/5ML suspension 30 mL  30 mL Oral Q4H PRN 06-08-2001, MD      . amLODipine (NORVASC) tablet 10 mg  10 mg Oral Daily Juanell Fairly, MD   10 mg at 05/02/21 0814  . bisacodyl (DULCOLAX) suppository 10 mg  10 mg Rectal Daily PRN 07/02/21, MD      . Chlorhexidine Gluconate Cloth 2 % PADS 6 each  6 each Topical Daily Juanell Fairly, MD   6 each at 05/02/21 1000  . docusate sodium (COLACE) capsule 100 mg  100 mg Oral BID 07/02/21, MD   100 mg at 05/02/21 0813  . enoxaparin (LOVENOX) injection 30 mg  30 mg Subcutaneous Q24H 07/02/21, MD   30 mg at 05/02/21 0815  . famotidine (PEPCID) tablet 20 mg  20 mg Oral Daily 07/02/21, MD   20 mg at 05/02/21 0813  . HYDROcodone-acetaminophen (NORCO/VICODIN) 5-325 MG per tablet 1-2  tablet  1-2 tablet Oral Q4H PRN Juanell Fairly, MD   1 tablet at 05/01/21 1806  . magnesium citrate solution 1 Bottle  1 Bottle Oral Once PRN Juanell Fairly, MD      . menthol-cetylpyridinium (CEPACOL) lozenge 3 mg  1 lozenge Oral PRN Juanell Fairly, MD       Or  . phenol (CHLORASEPTIC) mouth spray 1 spray  1 spray Mouth/Throat PRN Juanell Fairly, MD      . methocarbamol (ROBAXIN) tablet 500 mg  500 mg Oral Q6H PRN Juanell Fairly, MD       Or  . methocarbamol (ROBAXIN) 500 mg in dextrose 5 % 50 mL IVPB  500 mg Intravenous Q6H PRN Juanell Fairly, MD      . metoprolol succinate (TOPROL-XL) 24 hr tablet 50 mg  50 mg Oral Daily Juanell Fairly, MD   50 mg at 05/02/21 0814  . mirtazapine (REMERON) tablet 7.5 mg  7.5 mg Oral QHS Juanell Fairly, MD   7.5 mg at 05/01/21 2321  . morphine 2 MG/ML injection 0.5-1 mg  0.5-1  mg Intravenous Q2H PRN Juanell Fairly, MD   1 mg at 05/01/21 1708  . multivitamin with minerals tablet 1 tablet  1 tablet Oral Daily Juanell Fairly, MD   1 tablet at 05/02/21 0813  . ondansetron (ZOFRAN) tablet 4 mg  4 mg Oral Q6H PRN Juanell Fairly, MD       Or  . ondansetron [facility]) injection 4 mg  4 mg Intravenous Q6H PRN Juanell Fairly, MD      . pantoprazole (PROTONIX) EC tablet 40 mg  40 mg Oral Daily Juanell Fairly, MD   40 mg at 05/02/21 0813  . polyethylene glycol (MIRALAX / GLYCOLAX) packet 17 g  17 g Oral Daily Juanell Fairly, MD   17 g at 05/02/21 0814  . polyethylene glycol (MIRALAX / GLYCOLAX) packet 17 g  17 g Oral Daily PRN Juanell Fairly, MD      . senna (SENOKOT) tablet 8.6 mg  1 tablet Oral BID Juanell Fairly, MD   8.6 mg at 05/02/21 0813  . traMADol (ULTRAM) tablet 50 mg  50 mg Oral Q6H Juanell Fairly, MD   50 mg at 05/02/21 1221     Discharge Medications: Please see discharge summary for a list of discharge medications.  Relevant Imaging Results:  Relevant Lab Results:   Additional Information SS# [id number]  Westfield Cellar, RN

## 2021-05-02 NOTE — Progress Notes (Signed)
Subjective:  POD #1 s/p intramedullary fixation for left intertrochanteric hip fracture.   Patient up out of bed to a chair.  Her daughter is in the room.  Patient reports left pain as mild.    Objective:   VITALS:   Vitals:   05/01/21 1812 05/01/21 2040 05/02/21 0500 05/02/21 0834  BP: (!) 142/77 110/72 114/61 (!) 108/58  Pulse: 91 75 63 68  Resp: 16 16 16 16   Temp: 98.8 F (37.1 C) 98.6 F (37 C) 99.3 F (37.4 C) 98.8 F (37.1 C)  TempSrc: Oral Oral Oral   SpO2: 100% 98% 98% 100%  Weight:      Height:        PHYSICAL EXAM: Left lower extremity Neurovascular intact Sensation intact distally Intact pulses distally Dorsiflexion/Plantar flexion intact Incision: dressing C/D/I No cellulitis present Compartment soft  LABS  Results for orders placed or performed during the hospital encounter of 04/30/21 (from the past 24 hour(s))  CBC     Status: Abnormal   Collection Time: 05/02/21  6:23 AM  Result Value Ref Range   WBC 7.9 4.0 - 10.5 K/uL   RBC 2.78 (L) 3.87 - 5.11 MIL/uL   Hemoglobin 7.8 (L) 12.0 - 15.0 g/dL   HCT 07/02/21 (L) 93.8 - 18.2 %   MCV 86.7 80.0 - 100.0 fL   MCH 28.1 26.0 - 34.0 pg   MCHC 32.4 30.0 - 36.0 g/dL   RDW 99.3 71.6 - 96.7 %   Platelets 144 (L) 150 - 400 K/uL   nRBC 0.0 0.0 - 0.2 %  Basic metabolic panel     Status: Abnormal   Collection Time: 05/02/21  6:23 AM  Result Value Ref Range   Sodium 135 135 - 145 mmol/L   Potassium 4.0 3.5 - 5.1 mmol/L   Chloride 101 98 - 111 mmol/L   CO2 27 22 - 32 mmol/L   Glucose, Bld 103 (H) 70 - 99 mg/dL   BUN 25 (H) 8 - 23 mg/dL   Creatinine, Ser 07/02/21 (H) 0.44 - 1.00 mg/dL   Calcium 9.6 8.9 - 8.10 mg/dL   GFR, Estimated 48 (L) >60 mL/min   Anion gap 7 5 - 15    DG HIP OPERATIVE UNILAT W OR W/O PELVIS LEFT  Result Date: 05/01/2021 CLINICAL DATA:  Left femur nail for fracture EXAM: OPERATIVE left HIP (WITH PELVIS IF PERFORMED) 4 VIEWS TECHNIQUE: Fluoroscopic spot image(s) were submitted for  interpretation post-operatively. COMPARISON:  04/30/2021 FINDINGS: Left intertrochanteric fracture is been fixed with a compression screw and locking intramedullary nail extending into the distal left femur. Fracture alignment is satisfactory. Hardware position satisfactory IMPRESSION: Satisfactory ORIF left intertrochanteric fracture. Electronically Signed   By: 06/30/2021 M.D.   On: 05/01/2021 14:12   DG FEMUR PORT MIN 2 VIEWS LEFT  Result Date: 05/01/2021 CLINICAL DATA:  Post LEFT femoral nailing EXAM: LEFT FEMUR PORTABLE 2 VIEWS COMPARISON:  Portable exam 1457 hours compared to intraoperative images of 05/01/2021 FINDINGS: IM nail with compression screw at proximal LEFT femur across a reduced intertrochanteric fracture. Bones appear demineralized. No dislocation. Degenerative changes and chondrocalcinosis at LEFT knee. IMPRESSION: Post nailing of LEFT femoral fracture. Electronically Signed   By: 07/01/2021 M.D.   On: 05/01/2021 15:19    Assessment/Plan: 1 Day Post-Op   Principal Problem:   Femur fracture, left (HCC) Active Problems:   Hypertension   Arthritis   CKD (chronic kidney disease) stage 3, GFR 30-59 ml/min (HCC)   Hypercalcemia  Continue physical therapy.  Patient is weightbearing as tolerated on the left lower extremity.  Begin Lovenox 40 mg daily for DVT prophylaxis which should continue when she goes to skilled nursing facility.  Patient will need skilled nursing facility upon discharge.   Juanell Fairly , MD 05/02/2021, 2:02 PM

## 2021-05-03 DIAGNOSIS — D638 Anemia in other chronic diseases classified elsewhere: Secondary | ICD-10-CM

## 2021-05-03 LAB — HEMOGLOBIN AND HEMATOCRIT, BLOOD
HCT: 24.6 % — ABNORMAL LOW (ref 36.0–46.0)
Hemoglobin: 8 g/dL — ABNORMAL LOW (ref 12.0–15.0)

## 2021-05-03 LAB — BASIC METABOLIC PANEL
Anion gap: 5 (ref 5–15)
BUN: 25 mg/dL — ABNORMAL HIGH (ref 8–23)
CO2: 26 mmol/L (ref 22–32)
Calcium: 9.4 mg/dL (ref 8.9–10.3)
Chloride: 103 mmol/L (ref 98–111)
Creatinine, Ser: 1.16 mg/dL — ABNORMAL HIGH (ref 0.44–1.00)
GFR, Estimated: 43 mL/min — ABNORMAL LOW (ref >60)
Glucose, Bld: 90 mg/dL (ref 70–99)
Potassium: 3.8 mmol/L (ref 3.5–5.1)
Sodium: 134 mmol/L — ABNORMAL LOW (ref 135–145)

## 2021-05-03 LAB — CBC
HCT: 21 % — ABNORMAL LOW (ref 36.0–46.0)
Hemoglobin: 6.8 g/dL — ABNORMAL LOW (ref 12.0–15.0)
MCH: 28.2 pg (ref 26.0–34.0)
MCHC: 32.4 g/dL (ref 30.0–36.0)
MCV: 87.1 fL (ref 80.0–100.0)
Platelets: 121 10*3/uL — ABNORMAL LOW (ref 150–400)
RBC: 2.41 MIL/uL — ABNORMAL LOW (ref 3.87–5.11)
RDW: 13.9 % (ref 11.5–15.5)
WBC: 8.3 10*3/uL (ref 4.0–10.5)
nRBC: 0 % (ref 0.0–0.2)

## 2021-05-03 LAB — TYPE AND SCREEN
ABO/RH(D): O POS
Antibody Screen: NEGATIVE

## 2021-05-03 LAB — PREPARE RBC (CROSSMATCH)

## 2021-05-03 MED ORDER — SODIUM CHLORIDE 0.9% IV SOLUTION: Freq: Once | INTRAVENOUS | Status: DC

## 2021-05-03 NOTE — Progress Notes (Signed)
Subjective: 2 Days Post-Op Procedure(s) (LRB): INTRAMEDULLARY (IM) NAIL INTERTROCHANTRIC (Left) The patient is out of bed in a chair.  She is receiving 1 unit of packed red blood cells for hemoglobin of 6.8 today.  She is alert and cooperative.  Pain is minimal.  Dressings are dry.  Patient reports pain as mild.  Objective:   VITALS:   Vitals:   05/03/21 1351 05/03/21 1415  BP: (!) 107/54 (!) 107/54  Pulse: 66 66  Resp: 14 14  Temp: 97.9 F (36.6 C) 97.9 F (36.6 C)  SpO2: 100% 100%    Neurologically intact Incision: dressing C/D/I  LABS Recent Labs    05/01/21 0610 05/02/21 0623 05/03/21 0523  HGB 10.3* 7.8* 6.8*  HCT 32.5* 24.1* 21.0*  WBC 7.9 7.9 8.3  PLT 189 144* 121*    Recent Labs    05/01/21 0610 05/02/21 0623 05/03/21 0523  NA 139 135 134*  K 4.0 4.0 3.8  BUN 24* 25* 25*  CREATININE 0.92 1.05* 1.16*  GLUCOSE 110* 103* 90    No results for input(s): LABPT, INR in the last 72 hours.   Assessment/Plan: 2 Days Post-Op Procedure(s) (LRB): INTRAMEDULLARY (IM) NAIL INTERTROCHANTRIC (Left)   Up with therapy Discharge to SNF when stable.

## 2021-05-03 NOTE — Progress Notes (Signed)
Occupational Therapy Treatment Patient Details Name: Kimberly Stokes MRN: [id number] DOB: 1921/07/23 Today's Date: 05/03/2021    History of present illness presented to ER secondary to mechanical fall in home environment; admitted for management of L intertrochanteric hip fracture, s/p IM nailing (05/11/21), WBAT.   OT comments  Pt up in chair sitting comfortable - no pain -aware she broke her hip - she is s.p  L hip fx s/p IM nail. Prior to onset pt was independent with basic self care and household IADLs with exception of running errands/driving and paying bills which daughter reports that she helps with. Pt amb in the home with intermittent use of SPC. Pt participated in LB dressing - min A for R footwear but using AE for L LB dressing max v/c and mod A - limited by cognition and pain. Pt Min A and mod A for sit<> stand and transfer to commode but need mod v/c safety and hand placement. Pt left inchair for lunch with all needs met and in reach. Anticipate she will require STR in SNF setting to improve strength and safety with ADLs  And functional transfers prior to returning to her home environment.      Follow Up Recommendations  SNF    Equipment Recommendations  3 in 1 bedside commode;Tub/shower seat;Other (comment)    Recommendations for Other Services      Precautions / Restrictions Precautions Precautions: Fall Restrictions Weight Bearing Restrictions: Yes LLE Weight Bearing: Weight bearing as tolerated       Mobility Bed Mobility                    Transfers                      Balance                                           ADL either performed or assessed with clinical judgement   ADL                                               Vision       Perception     Praxis      Cognition Arousal/Alertness: Awake/alert   Overall Cognitive Status: History of cognitive impairments - at baseline                                  General Comments: follow direction and provide some correct history - but STM- poor - forget 3-5 min later what she can and cannot do or done in session        Exercises Other Exercises Other Exercises: Pt was able to use reacher and sock aid doff and donn L sock - Max v/c and mod A - she can follow directions and conceps- but ? memory limiting ability to use AE Other Exercises: R sock doff and donn min A - using hands - limited by pain mostly Other Exercises: Sit <> stand and tranfer commode - Min A- mod A and safety cueing for hand placement and not pulling on Byr[place] - limited by pain   Shoulder Instructions  General Comments      Pertinent Vitals/ Pain       Pain Score: 4  Pain Location: L hip only with mobilization, pt is otherwise resting comfortably. Pain Descriptors / Indicators: Aching;Grimacing;Guarding  Home Living Family/patient expects to be discharged to:: Private residence Living Arrangements: Alone Available Help at Discharge: Family;Available PRN/intermittently Type of Home: House Home Access: Ramped entrance     Home Layout: One level     Bathroom Shower/Tub: Tub only         Home Equipment: Cane - single point          Prior Functioning/Environment              Frequency  Min 2X/week        Progress Toward Goals  OT Goals(current goals can now be found in the care plan section)     Acute Rehab OT Goals Patient Stated Goal: get better so I can go home OT Goal Formulation: With patient/family Time For Goal Achievement: 05/16/21 Potential to Achieve Goals: Good  Plan      Co-evaluation                 AM-PAC OT "6 Clicks" Daily Activity     Outcome Measure                    End of Session Equipment Utilized During Treatment: Gait belt  OT Visit Diagnosis: Unsteadiness on feet (R26.81);Muscle weakness (generalized) (M62.81)   Activity Tolerance Patient limited by pain    Patient Left in bed;with call bell/phone within reach;with bed alarm set;with family/visitor present   Nurse Communication Mobility status;Patient requests pain meds        Time: 1200-1225 OT Time Calculation (min): 25 min  Charges: OT General Charges $OT Visit: 1 Visit OT Treatments $Self Care/Home Management : 23-37 mins     Jasmen Emrich OTR/L,CLT 05/03/2021, 1:54 PM

## 2021-05-03 NOTE — Progress Notes (Signed)
Physical Therapy Treatment Patient Details Name: Kimberly Stokes MRN: [id number] DOB: 1921-10-01 Today's Date: 05/03/2021    History of Present Illness presented to ER secondary to mechanical fall in home environment; admitted for management of L intertrochanteric hip fracture, s/p IM nailing (05/11/21), WBAT.    PT Comments    Pt was asleep in long sitting. Easily awakes and agrees to session and OOB activity. Required more assistance to exit bed and stand today than yesterday. Pt present with increased pain today versus previous date but unable to give accurate pain rating. She stood and pivot to recliner. Previous date pt was able to advance(lift BLEs to taking steps) Overall pt is extremely pleasant but confused. Recommend SNF at DC to address deficits while maximizing Independence with ADLs.    Follow Up Recommendations  SNF     Equipment Recommendations  Rolling [place] with 5" wheels;3in1 (PT)       Precautions / Restrictions Precautions Precautions: Fall Restrictions Weight Bearing Restrictions: Yes LLE Weight Bearing: Weight bearing as tolerated    Mobility  Bed Mobility Overal bed mobility: Needs Assistance Bed Mobility: Sit to Supine;Supine to Sit     Supine to sit: Mod assist Sit to supine: Max assist        Transfers Overall transfer level: Needs assistance Equipment used: Rolling [place] (2 wheeled) Transfers: Sit to/from Stand Sit to Stand: Min assist;Mod assist         General transfer comment: from elevated bed height, STS with min-mod assist + vcs for handplacement and improved technique.  Ambulation/Gait Ambulation/Gait assistance: Mod assist Gait Distance (Feet): 3 Feet Assistive device: Rolling Frankowski (2 wheeled) Gait Pattern/deviations: Antalgic;Step-to pattern Gait velocity: decreased   General Gait Details: Pt struggles to clear LEs to tak,ing steps. was able previos afternoon however unable today 2/2 to pain      Balance Overall  balance assessment: Needs assistance Sitting-balance support: No upper extremity supported;Feet supported Sitting balance-Leahy Scale: Good     Standing balance support: Bilateral upper extremity supported Standing balance-Leahy Scale: Poor Standing balance comment: high fall risk         Cognition Arousal/Alertness: Awake/alert Behavior During Therapy: WFL for tasks assessed/performed Overall Cognitive Status: History of cognitive impairments - at baseline        General Comments: follow direction and provide some correct history - but STM- poor - forget 3-5 min later what she can and cannot do or done in session      Exercises Other Exercises Other Exercises: Pt was able to use reacher and sock aid doff and donn L sock - Max v/c and mod A - she can follow directions and conceps- but ? memory limiting ability to use AE Other Exercises: R sock doff and donn min A - using hands - limited by pain mostly Other Exercises: Sit <> stand and tranfer commode - Min A- mod A and safety cueing for hand placement and not pulling on [place] - limited by pain        Pertinent Vitals/Pain Pain Assessment: Faces Pain Score: 4  Faces Pain Scale: Hurts whole lot Pain Location: L hip only with mobilization, pt is otherwise resting comfortably. Pain Descriptors / Indicators: Aching;Grimacing;Guarding Pain Intervention(s): Limited activity within patient's tolerance;Monitored during session;Premedicated before session;Repositioned;Ice applied    Home Living Family/patient expects to be discharged to:: Private residence Living Arrangements: Alone Available Help at Discharge: Family;Available PRN/intermittently Type of Home: House Home Access: Ramped entrance   Home Layout: One level Home Equipment: Gilmer Mor -  single point          PT Goals (current goals can now be found in the care plan section) Acute Rehab PT Goals Patient Stated Goal: none stated Progress towards PT goals: Progressing  toward goals    Frequency    BID      PT Plan Current plan remains appropriate       AM-PAC PT "6 Clicks" Mobility   Outcome Measure  Help needed turning from your back to your side while in a flat bed without using bedrails?: A Lot Help needed moving from lying on your back to sitting on the side of a flat bed without using bedrails?: A Lot Help needed moving to and from a bed to a chair (including a wheelchair)?: A Lot Help needed standing up from a chair using your arms (e.g., wheelchair or bedside chair)?: A Lot Help needed to walk in hospital room?: A Lot Help needed climbing 3-5 steps with a railing? : A Lot 6 Click Score: 12    End of Session Equipment Utilized During Treatment: Gait belt Activity Tolerance: Patient tolerated treatment well Patient left: in chair;with call bell/phone within reach;with chair alarm set Nurse Communication: Mobility status PT Visit Diagnosis: Muscle weakness (generalized) (M62.81);Difficulty in walking, not elsewhere classified (R26.2);Pain Pain - Right/Left: Left Pain - part of body: Hip     Time: 9417-4081 PT Time Calculation (min) (ACUTE ONLY): 15 min  Charges:  $Therapeutic Activity: 8-22 mins                     Jetta Lout PTA 05/03/21, 2:26 PM

## 2021-05-03 NOTE — Progress Notes (Signed)
Physical Therapy Treatment Patient Details Name: Kimberly Stokes MRN: [id number] DOB: 1921-03-10 Today's Date: 05/03/2021    History of Present Illness presented to ER secondary to mechanical fall in home environment; admitted for management of L intertrochanteric hip fracture, s/p IM nailing (05/11/21), WBAT.    PT Comments    PM PT session prior to transfusion. Pt was cleared to participate and tolerated both session well. She was attempting to get out of recliner upon arriving with daughter sitting at bedside. Pt was able to stand form recliner with mod assist and take ~ 5 steps to EOB. This afternoon, able to clear LEs to take steps. During AM session was unable 2/2 to pain. Overall pt is progressing but slowly. Highly recommend DC to SNF at DC to assist pt to PLOF while improving independence with ADLs.    Follow Up Recommendations  SNF     Equipment Recommendations  Rolling [place] with 5" wheels;3in1 (PT)       Precautions / Restrictions Precautions Precautions: Fall Restrictions Weight Bearing Restrictions: Yes LLE Weight Bearing: Weight bearing as tolerated    Mobility  Bed Mobility Overal bed mobility: Needs Assistance Bed Mobility: Sit to Supine     Supine to sit: Mod assist Sit to supine: Max assist   General bed mobility comments: Max assist to progress back to supine form EOB sitting    Transfers Overall transfer level: Needs assistance Equipment used: Rolling [place] (2 wheeled) Transfers: Sit to/from Stand Sit to Stand: Mod assist         General transfer comment: pt required mod assist to stand from recliner height surface. VCs for correct handplacement and improved technique. Increased time to perform 2/2 to pain  Ambulation/Gait Ambulation/Gait assistance: Mod assist Gait Distance (Feet): 5 Feet Assistive device: Rolling Witting (2 wheeled) Gait Pattern/deviations: Antalgic;Step-to pattern Gait velocity: decreased   General Gait Details: pt was  able to clear LEs to take steps to EOB this afternoon. In AM session, pt unable to clear floor.       Balance Overall balance assessment: Needs assistance Sitting-balance support: No upper extremity supported;Feet supported Sitting balance-Leahy Scale: Good     Standing balance support: Bilateral upper extremity supported Standing balance-Leahy Scale: Poor Standing balance comment: high fall risk       Cognition Arousal/Alertness: Awake/alert Behavior During Therapy: WFL for tasks assessed/performed Overall Cognitive Status: History of cognitive impairments - at baseline      General Comments: Pt has baseline dementia per daughter. " She is always about like this." Pt consistently follows commands and is pleasantly confused. Disoriented to overall situation      Exercises General Exercises - Lower Extremity Ankle Circles/Pumps: AROM;10 reps Quad Sets: AROM;10 reps Gluteal Sets: AROM;10 reps Hip ABduction/ADduction: AAROM;10 reps Straight Leg Raises: AAROM;10 reps Other Exercises Other Exercises: Pt was able to use reacher and sock aid doff and donn L sock - Max v/c and mod A - she can follow directions and conceps- but ? memory limiting ability to use AE Other Exercises: R sock doff and donn min A - using hands - limited by pain mostly Other Exercises: Sit <> stand and tranfer commode - Min A- mod A and safety cueing for hand placement and not pulling on Candela - limited by pain        Pertinent Vitals/Pain Pain Assessment: Faces Pain Score: 4  Faces Pain Scale: Hurts whole lot Pain Location: L hip pain with movements Pain Descriptors / Indicators: Aching;Grimacing;Guarding Pain Intervention(s): Limited activity  within patient's tolerance;Repositioned;Ice applied;Monitored during session    Home Living Family/patient expects to be discharged to:: Private residence Living Arrangements: Alone Available Help at Discharge: Family;Available PRN/intermittently Type of  Home: House Home Access: Ramped entrance   Home Layout: One level Home Equipment: Cane - single point          PT Goals (current goals can now be found in the care plan section) Acute Rehab PT Goals Patient Stated Goal: none stated Progress towards PT goals: Progressing toward goals    Frequency    BID      PT Plan Current plan remains appropriate       AM-PAC PT "6 Clicks" Mobility   Outcome Measure  Help needed turning from your back to your side while in a flat bed without using bedrails?: A Lot Help needed moving from lying on your back to sitting on the side of a flat bed without using bedrails?: A Lot Help needed moving to and from a bed to a chair (including a wheelchair)?: A Lot Help needed standing up from a chair using your arms (e.g., wheelchair or bedside chair)?: A Lot Help needed to walk in hospital room?: A Lot Help needed climbing 3-5 steps with a railing? : A Lot 6 Click Score: 12    End of Session Equipment Utilized During Treatment: Gait belt Activity Tolerance: Patient tolerated treatment well Patient left: in bed;with call bell/phone within reach;with bed alarm set;with family/visitor present Nurse Communication: Mobility status PT Visit Diagnosis: Muscle weakness (generalized) (M62.81);Difficulty in walking, not elsewhere classified (R26.2);Pain Pain - Right/Left: Left Pain - part of body: Hip     Time: 9604-5409 PT Time Calculation (min) (ACUTE ONLY): 21 min  Charges:  $Therapeutic Activity: 8-22 mins                     Jetta Lout PTA 05/03/21, 4:04 PM

## 2021-05-03 NOTE — Progress Notes (Signed)
PROGRESS NOTE    Kimberly Stokes  WPY:[id number] DOB: 1921-10-14 DOA: 04/30/2021 PCP: Hillery Aldo, MD   Assessment & Plan:   Principal Problem:   Femur fracture, left (HCC) Active Problems:   Hypertension   Arthritis   CKD (chronic kidney disease) stage 3, GFR 30-59 ml/min (HCC)   Hypercalcemia   Left femur fracture: secondary to fall at home. Norco, morphine prn. S/p intramedullary fixation for left intertrochanteric hip fracture on 05/01/21 as per ortho surg. PT/OT recs SNF   HTN: continue on metoprolol, amlodipine   CKDIIIa: Cr is labile. Avoid nephrotoxic meds   Likely ACD: likely secondary to CKD w/ component of acute blood loss anemia from surgery stated above. Will give 1 unit of pRBCs today as Hb 6.8. Repeat H&H 4 hours post transfusion   Thrombocytopenia: etiology unclear. Will continue to monitor   Hypercalcemia: resolved  Hyperglycemia: no hx of DM. Resolved  DVT prophylaxis: SCDs Code Status: full  Family Communication: discussed pt's care w/ pt's daughter, [name], and answered her questions Disposition Plan: likely d/c to SNF   Level of care: Med-Surg   Status is: Inpatient  Remains inpatient appropriate because:Ongoing diagnostic testing needed not appropriate for outpatient work up, Unsafe d/c plan, IV treatments appropriate due to intensity of illness or inability to take PO and Inpatient level of care appropriate due to severity of illness   Dispo: The patient is from: Home              Anticipated d/c is to: SNF              Patient currently is not medically stable to d/c.   Difficult to place patient : unclear    Consultants:   Ortho surg    Procedures:    Antimicrobials:    Subjective: Pt c/o left leg pain.   Objective: Vitals:   05/02/21 2059 05/02/21 2305 05/03/21 0425 05/03/21 0745  BP: (!) 104/59 (!) 100/51 (!) 101/54 131/60  Pulse: 70 70 72 81  Resp: 17 17 17 14   Temp: 98.7 F (37.1 C) 98.2 F (36.8 C) 98.4 F (36.9 C)  100.2 F (37.9 C)  TempSrc: Oral Oral Oral   SpO2: 99% 98% 100% 99%  Weight:      Height:        Intake/Output Summary (Last 24 hours) at 05/03/2021 0826 Last data filed at 05/03/2021 0634 Gross per 24 hour  Intake 1235.69 ml  Output 250 ml  Net 985.69 ml   Filed Weights   04/30/21 1238 05/01/21 1055  Weight: 50 kg 50 kg    Examination:  General exam: Appears uncomfortable  Respiratory system: clear breath sounds b/l. No wheezes Cardiovascular system: S1 & S2+. No clicks or rubs  Gastrointestinal system: Abd is soft, NT, ND & hypoactive bowel sounds  Central nervous system:  Alert and oriented. Moves all extremities  Psychiatry: judgement and insight appear normal. Flat mood and affect     Data Reviewed: I have personally reviewed following labs and imaging studies  CBC: Recent Labs  Lab 04/30/21 1236 05/01/21 0610 05/02/21 0623 05/03/21 0523  WBC 5.2 7.9 7.9 8.3  NEUTROABS 3.5  --   --   --   HGB 10.9* 10.3* 7.8* 6.8*  HCT 34.0* 32.5* 24.1* 21.0*  MCV 89.0 87.4 86.7 87.1  PLT 199 189 144* 121*   Basic Metabolic Panel: Recent Labs  Lab 04/30/21 1236 05/01/21 0610 05/02/21 0623 05/03/21 0523  NA 142 139 135 134*  K  3.9 4.0 4.0 3.8  CL 107 103 101 103  CO2 28 29 27 26   GLUCOSE 89 110* 103* 90  BUN 31* 24* 25* 25*  CREATININE 1.19* 0.92 1.05* 1.16*  CALCIUM 11.1* 10.6* 9.6 9.4   GFR: Estimated Creatinine Clearance: 21.9 mL/min (A) (by C-G formula based on SCr of 1.16 mg/dL (H)). Liver Function Tests: Recent Labs  Lab 04/30/21 1236  AST 24  ALT 12  ALKPHOS 44  BILITOT 0.5  PROT 6.1*  ALBUMIN 3.6   No results for input(s): LIPASE, AMYLASE in the last 168 hours. No results for input(s): AMMONIA in the last 168 hours. Coagulation Profile: No results for input(s): INR, PROTIME in the last 168 hours. Cardiac Enzymes: No results for input(s): CKTOTAL, CKMB, CKMBINDEX, TROPONINI in the last 168 hours. BNP (last 3 results) No results for input(s):  PROBNP in the last 8760 hours. HbA1C: No results for input(s): HGBA1C in the last 72 hours. CBG: No results for input(s): GLUCAP in the last 168 hours. Lipid Profile: No results for input(s): CHOL, HDL, LDLCALC, TRIG, CHOLHDL, LDLDIRECT in the last 72 hours. Thyroid Function Tests: No results for input(s): TSH, T4TOTAL, FREET4, T3FREE, THYROIDAB in the last 72 hours. Anemia Panel: No results for input(s): VITAMINB12, FOLATE, FERRITIN, TIBC, IRON, RETICCTPCT in the last 72 hours. Sepsis Labs: No results for input(s): PROCALCITON, LATICACIDVEN in the last 168 hours.  Recent Results (from the past 240 hour(s))  Resp Panel by RT-PCR (Flu A&B, Covid) Nasopharyngeal Swab     Status: None   Collection Time: 04/30/21  1:59 PM   Specimen: Nasopharyngeal Swab; Nasopharyngeal(NP) swabs in vial transport medium  Result Value Ref Range Status   SARS Coronavirus 2 by RT PCR NEGATIVE NEGATIVE Final    Comment: (NOTE) SARS-CoV-2 target nucleic acids are NOT DETECTED.  The SARS-CoV-2 RNA is generally detectable in upper respiratory specimens during the acute phase of infection. The lowest concentration of SARS-CoV-2 viral copies this assay can detect is 138 copies/mL. A negative result does not preclude SARS-Cov-2 infection and should not be used as the sole basis for treatment or other patient management decisions. A negative result may occur with  improper specimen collection/handling, submission of specimen other than nasopharyngeal swab, presence of viral mutation(s) within the areas targeted by this assay, and inadequate number of viral copies(<138 copies/mL). A negative result must be combined with clinical observations, patient history, and epidemiological information. The expected result is Negative.  Fact Sheet for Patients:  06/30/21  Fact Sheet for Healthcare Providers:  BloggerCourse.com  This test is no t yet approved or  cleared by the [employer] FDA and  has been authorized for detection and/or diagnosis of SARS-CoV-2 by FDA under an Emergency Use Authorization (EUA). This EUA will remain  in effect (meaning this test can be used) for the duration of the COVID-19 declaration under Section 564(b)(1) of the Act, 21 U.S.C.section 360bbb-3(b)(1), unless the authorization is terminated  or revoked sooner.       Influenza A by PCR NEGATIVE NEGATIVE Final   Influenza B by PCR NEGATIVE NEGATIVE Final    Comment: (NOTE) The Xpert Xpress SARS-CoV-2/FLU/RSV plus assay is intended as an aid in the diagnosis of influenza from Nasopharyngeal swab specimens and should not be used as a sole basis for treatment. Nasal washings and aspirates are unacceptable for Xpert Xpress SARS-CoV-2/FLU/RSV testing.  Fact Sheet for Patients: [place]  Fact Sheet for Healthcare Providers: BloggerCourse.com  This test is not yet approved or cleared by the  [place] [employer] and has been authorized for detection and/or diagnosis of SARS-CoV-2 by FDA under an TEFL teacher (EUA). This EUA will remain in effect (meaning this test can be used) for the duration of the COVID-19 declaration under Section 564(b)(1) of the Act, 21 U.S.C. section 360bbb-3(b)(1), unless the authorization is terminated or revoked.  Performed at [facility], [address] [place], [place] [address]          Radiology Studies: DG HIP OPERATIVE UNILAT W OR W/O PELVIS LEFT  Result Date: 05/01/2021 CLINICAL DATA:  Left femur nail for fracture EXAM: OPERATIVE left HIP (WITH PELVIS IF PERFORMED) 4 VIEWS TECHNIQUE: Fluoroscopic spot image(s) were submitted for interpretation post-operatively. COMPARISON:  04/30/2021 FINDINGS: Left intertrochanteric fracture is been fixed with a compression screw and locking intramedullary nail extending into the distal left femur. Fracture  alignment is satisfactory. Hardware position satisfactory IMPRESSION: Satisfactory ORIF left intertrochanteric fracture. Electronically Signed   By: Marlan Palau M.D.   On: 05/01/2021 14:12   DG FEMUR PORT MIN 2 VIEWS LEFT  Result Date: 05/01/2021 CLINICAL DATA:  Post LEFT femoral nailing EXAM: LEFT FEMUR PORTABLE 2 VIEWS COMPARISON:  Portable exam 1457 hours compared to intraoperative images of 05/01/2021 FINDINGS: IM nail with compression screw at proximal LEFT femur across a reduced intertrochanteric fracture. Bones appear demineralized. No dislocation. Degenerative changes and chondrocalcinosis at LEFT knee. IMPRESSION: Post nailing of LEFT femoral fracture. Electronically Signed   By: Ulyses Southward M.D.   On: 05/01/2021 15:19        Scheduled Meds: . (feeding supplement) PROSource Plus  30 mL Oral BID BM  . sodium chloride   Intravenous Once  . amLODipine  10 mg Oral Daily  . Chlorhexidine Gluconate Cloth  6 each Topical Daily  . docusate sodium  100 mg Oral BID  . enoxaparin (LOVENOX) injection  30 mg Subcutaneous Q24H  . famotidine  20 mg Oral Daily  . metoprolol succinate  50 mg Oral Daily  . mirtazapine  7.5 mg Oral QHS  . multivitamin with minerals  1 tablet Oral Daily  . pantoprazole  40 mg Oral Daily  . polyethylene glycol  17 g Oral Daily  . senna  1 tablet Oral BID  . traMADol  50 mg Oral Q6H   Continuous Infusions: . sodium chloride Stopped (05/02/21 2139)  . methocarbamol (ROBAXIN) IV       LOS: 3 days    Time spent: 30 mins     Charise Killian, MD [facility] Pager [phone]  If 7PM-7AM, please contact night-coverage 05/03/2021, 8:26 AM

## 2021-05-04 LAB — CBC
HCT: 26.8 % — ABNORMAL LOW (ref 36.0–46.0)
Hemoglobin: 8.8 g/dL — ABNORMAL LOW (ref 12.0–15.0)
MCH: 28.7 pg (ref 26.0–34.0)
MCHC: 32.8 g/dL (ref 30.0–36.0)
MCV: 87.3 fL (ref 80.0–100.0)
Platelets: 159 10*3/uL (ref 150–400)
RBC: 3.07 MIL/uL — ABNORMAL LOW (ref 3.87–5.11)
RDW: 13.5 % (ref 11.5–15.5)
WBC: 9.5 10*3/uL (ref 4.0–10.5)
nRBC: 0 % (ref 0.0–0.2)

## 2021-05-04 LAB — TYPE AND SCREEN
ABO/RH(D): O POS
Antibody Screen: NEGATIVE
Unit division: 0

## 2021-05-04 LAB — BPAM RBC
Blood Product Expiration Date: 202207052359
ISSUE DATE / TIME: 202206041340
Unit Type and Rh: 5100

## 2021-05-04 LAB — BASIC METABOLIC PANEL
Anion gap: 8 (ref 5–15)
BUN: 20 mg/dL (ref 8–23)
CO2: 27 mmol/L (ref 22–32)
Calcium: 10.1 mg/dL (ref 8.9–10.3)
Chloride: 97 mmol/L — ABNORMAL LOW (ref 98–111)
Creatinine, Ser: 0.9 mg/dL (ref 0.44–1.00)
GFR, Estimated: 58 mL/min — ABNORMAL LOW (ref >60)
Glucose, Bld: 97 mg/dL (ref 70–99)
Potassium: 3.8 mmol/L (ref 3.5–5.1)
Sodium: 132 mmol/L — ABNORMAL LOW (ref 135–145)

## 2021-05-04 MED ORDER — ALPRAZOLAM 0.25 MG PO TABS
0.2500 mg | ORAL_TABLET | Freq: Two times a day (BID) | ORAL | Status: DC | PRN
  Administered 2021-05-04: 0.25 mg via ORAL
  Filled 2021-05-04: qty 1

## 2021-05-04 NOTE — Progress Notes (Signed)
Patient alert to self and makes frequent attempts to get OOB. Patient very impulsive and confused.MD aware and new orders placed for sitter.

## 2021-05-04 NOTE — Progress Notes (Signed)
Physical Therapy Treatment Patient Details Name: Kimberly Stokes MRN: [id number] DOB: [birth year] Today's Date: 05/04/2021    History of Present Illness presented to ER secondary to mechanical fall in home environment; admitted for management of L intertrochanteric hip fracture, s/p IM nailing (05/11/21), WBAT.    PT Comments    Pt on bed pan upon arrival.  Participated in exercises as described below to increase comfort for mobility.  She puts in excellent effort during session.  Slow but only requires min a x 1 for bed mobility mostly to support LLE.  She transfers to commode with min/mod a x 1 with no results.  She is able to walk around bed to recliner with min/mod a x 1.  Very guarded WB on LLE keeping knee flexed and heavy use of arms.  Overall does well and remains in recliner with sitter in room.   Follow Up Recommendations  SNF     Equipment Recommendations  Rolling Jankowski with 5" wheels;3in1 (PT)    Recommendations for Other Services       Precautions / Restrictions Precautions Precautions: Fall Restrictions Weight Bearing Restrictions: Yes LLE Weight Bearing: Weight bearing as tolerated    Mobility  Bed Mobility Overal bed mobility: Needs Assistance Bed Mobility: Supine to Sit     Supine to sit: Min assist     General bed mobility comments: increased time but excellent effort to do on her own    Transfers Overall transfer level: Needs assistance Equipment used: Rolling Vowles (2 wheeled) Transfers: Sit to/from Stand Sit to Stand: Min assist         General transfer comment: slow but again good effort  Ambulation/Gait Ambulation/Gait assistance: Min assist;Mod assist Gait Distance (Feet): 12 Feet Assistive device: Rolling Locy (2 wheeled) Gait Pattern/deviations: Antalgic;Step-to pattern Gait velocity: decreased   General Gait Details: walked from left to right side of bed (commode to recliner)   Stairs             Wheelchair Mobility     Modified Rankin (Stroke Patients Only)       Balance Overall balance assessment: Needs assistance Sitting-balance support: No upper extremity supported;Feet supported Sitting balance-Leahy Scale: Good     Standing balance support: Bilateral upper extremity supported Standing balance-Leahy Scale: Poor Standing balance comment: high fall risk                            Cognition Arousal/Alertness: Awake/alert Behavior During Therapy: WFL for tasks assessed/performed Overall Cognitive Status: History of cognitive impairments - at baseline                                        Exercises General Exercises - Lower Extremity Ankle Circles/Pumps: AROM;10 reps Quad Sets: AROM;10 reps Gluteal Sets: AROM;10 reps Hip ABduction/ADduction: AAROM;10 reps Straight Leg Raises: AAROM;10 reps Other Exercises Other Exercises: to commode with no results    General Comments        Pertinent Vitals/Pain Pain Assessment: Faces Faces Pain Scale: Hurts even more Pain Location: L hip pain with movements Pain Descriptors / Indicators: Aching;Grimacing;Guarding Pain Intervention(s): Limited activity within patient's tolerance;Monitored during session;Repositioned    Home Living                      Prior Function  PT Goals (current goals can now be found in the care plan section) Progress towards PT goals: Progressing toward goals    Frequency    BID      PT Plan Current plan remains appropriate    Co-evaluation              AM-PAC PT "6 Clicks" Mobility   Outcome Measure  Help needed turning from your back to your side while in a flat bed without using bedrails?: A Little Help needed moving from lying on your back to sitting on the side of a flat bed without using bedrails?: A Little Help needed moving to and from a bed to a chair (including a wheelchair)?: A Lot Help needed standing up from a chair using your arms  (e.g., wheelchair or bedside chair)?: A Little Help needed to walk in hospital room?: A Lot Help needed climbing 3-5 steps with a railing? : Total 6 Click Score: 14    End of Session Equipment Utilized During Treatment: Gait belt Activity Tolerance: Patient tolerated treatment well Patient left: with call bell/phone within reach;in chair;with nursing/sitter in room Nurse Communication: Mobility status PT Visit Diagnosis: Muscle weakness (generalized) (M62.81);Difficulty in walking, not elsewhere classified (R26.2);Pain Pain - Right/Left: Left Pain - part of body: Hip     Time: 7939-0300 PT Time Calculation (min) (ACUTE ONLY): 15 min  Charges:  $Gait Training: 8-22 mins                    Danielle Dess, PTA 05/04/21, 11:12 AM

## 2021-05-04 NOTE — Progress Notes (Signed)
Subjective: 3 Days Post-Op Procedure(s) (LRB): INTRAMEDULLARY (IM) NAIL INTERTROCHANTRIC (Left) Patient is alert and getting out of bed.  The dressings are clean and dry.  Her hemoglobin is up to 8.8 today.  Patient reports pain as mild.  Objective:   VITALS:   Vitals:   05/04/21 0730 05/04/21 1246  BP: (!) 115/56 138/61  Pulse: 72 75  Resp: 18 18  Temp:    SpO2: 100% 93%    Neurologically intact Sensation intact distally Incision: dressing C/D/I  LABS Recent Labs    05/02/21 0623 05/03/21 0523 05/03/21 1922 05/04/21 0619  HGB 7.8* 6.8* 8.0* 8.8*  HCT 24.1* 21.0* 24.6* 26.8*  WBC 7.9 8.3  --  9.5  PLT 144* 121*  --  159    Recent Labs    05/02/21 0623 05/03/21 0523 05/04/21 0619  NA 135 134* 132*  K 4.0 3.8 3.8  BUN 25* 25* 20  CREATININE 1.05* 1.16* 0.90  GLUCOSE 103* 90 97    No results for input(s): LABPT, INR in the last 72 hours.   Assessment/Plan: 3 Days Post-Op Procedure(s) (LRB): INTRAMEDULLARY (IM) NAIL INTERTROCHANTRIC (Left)   Up with therapy Discharge to SNF

## 2021-05-04 NOTE — Progress Notes (Signed)
PROGRESS NOTE    Kimberly Stokes  DDU:[id number] DOB: 11-Dec-1920 DOA: 04/30/2021 PCP: Hillery Aldo, MD   Assessment & Plan:   Principal Problem:   Femur fracture, left (HCC) Active Problems:   Hypertension   Arthritis   CKD (chronic kidney disease) stage 3, GFR 30-59 ml/min (HCC)   Hypercalcemia   Left femur fracture: secondary to fall at home. Norco, morphine prn. S/p intramedullary fixation for left intertrochanteric hip fracture on 05/01/21 as per ortho surg. PT/OT recs SNF. Waiting for SNF placement now    HTN: continue on CCB, BB   CKDIIIa: Cr is trending down today   Likely ACD: likely secondary to CKD w/ component of acute blood loss anemia from surgery stated above. S/p 1 unit of pRBCs transfused on 05/03/21. H&H are trending up   Thrombocytopenia: resolved   Hypercalcemia: resolved  Hyperglycemia:  Resolved   DVT prophylaxis: SCDs Code Status: full  Family Communication: discussed pt's care w/ pt's daughter, [name], and answered her questions Disposition Plan: likely d/c to SNF   Level of care: Med-Surg   Status is: Inpatient  Remains inpatient appropriate because:Ongoing diagnostic testing needed not appropriate for outpatient work up, Unsafe d/c plan, IV treatments appropriate due to intensity of illness or inability to take PO and Inpatient level of care appropriate due to severity of illness, waiting on SNF placement    Dispo: The patient is from: Home              Anticipated d/c is to: SNF              Patient currently is medically stable for d/c    Difficult to place patient : unclear    Consultants:   Ortho surg    Procedures:    Antimicrobials:    Subjective: Pt c/o leg pain intermittently   Objective: Vitals:   05/03/21 2057 05/03/21 2317 05/04/21 0446 05/04/21 0730  BP: 129/65 134/63 (!) 119/53 (!) 115/56  Pulse: 66 64 66 72  Resp: 17 17 17 18   Temp: 97.6 F (36.4 C) 97.6 F (36.4 C) 98.1 F (36.7 C)   TempSrc:   Oral   SpO2:  100% 100% 98% 100%  Weight:      Height:        Intake/Output Summary (Last 24 hours) at 05/04/2021 0802 Last data filed at 05/04/2021 0130 Gross per 24 hour  Intake 840 ml  Output 400 ml  Net 440 ml   Filed Weights   04/30/21 1238 05/01/21 1055  Weight: 50 kg 50 kg    Examination:  General exam: Appears comfortable   Respiratory system: clear breath sounds b/l. No rales, wheezes Cardiovascular system: S1/S2+. No rubs or clicks   Gastrointestinal system: Abd is soft, NT, ND & hypoactive bowel sounds  Central nervous system:  Alert and awake. Moves all extremities  Psychiatry: judgement and insight appear normal. Appropriate mood and affect    Data Reviewed: I have personally reviewed following labs and imaging studies  CBC: Recent Labs  Lab 04/30/21 1236 05/01/21 0610 05/02/21 0623 05/03/21 0523 05/03/21 1922 05/04/21 0619  WBC 5.2 7.9 7.9 8.3  --  9.5  NEUTROABS 3.5  --   --   --   --   --   HGB 10.9* 10.3* 7.8* 6.8* 8.0* 8.8*  HCT 34.0* 32.5* 24.1* 21.0* 24.6* 26.8*  MCV 89.0 87.4 86.7 87.1  --  87.3  PLT 199 189 144* 121*  --  159   Basic Metabolic  Panel: Recent Labs  Lab 04/30/21 1236 05/01/21 0610 05/02/21 0623 05/03/21 0523 05/04/21 0619  NA 142 139 135 134* 132*  K 3.9 4.0 4.0 3.8 3.8  CL 107 103 101 103 97*  CO2 28 29 27 26 27   GLUCOSE 89 110* 103* 90 97  BUN 31* 24* 25* 25* 20  CREATININE 1.19* 0.92 1.05* 1.16* 0.90  CALCIUM 11.1* 10.6* 9.6 9.4 10.1   GFR: Estimated Creatinine Clearance: 28.2 mL/min (by C-G formula based on SCr of 0.9 mg/dL). Liver Function Tests: Recent Labs  Lab 04/30/21 1236  AST 24  ALT 12  ALKPHOS 44  BILITOT 0.5  PROT 6.1*  ALBUMIN 3.6   No results for input(s): LIPASE, AMYLASE in the last 168 hours. No results for input(s): AMMONIA in the last 168 hours. Coagulation Profile: No results for input(s): INR, PROTIME in the last 168 hours. Cardiac Enzymes: No results for input(s): CKTOTAL, CKMB, CKMBINDEX,  TROPONINI in the last 168 hours. BNP (last 3 results) No results for input(s): PROBNP in the last 8760 hours. HbA1C: No results for input(s): HGBA1C in the last 72 hours. CBG: No results for input(s): GLUCAP in the last 168 hours. Lipid Profile: No results for input(s): CHOL, HDL, LDLCALC, TRIG, CHOLHDL, LDLDIRECT in the last 72 hours. Thyroid Function Tests: No results for input(s): TSH, T4TOTAL, FREET4, T3FREE, THYROIDAB in the last 72 hours. Anemia Panel: No results for input(s): VITAMINB12, FOLATE, FERRITIN, TIBC, IRON, RETICCTPCT in the last 72 hours. Sepsis Labs: No results for input(s): PROCALCITON, LATICACIDVEN in the last 168 hours.  Recent Results (from the past 240 hour(s))  Resp Panel by RT-PCR (Flu A&B, Covid) Nasopharyngeal Swab     Status: None   Collection Time: 04/30/21  1:59 PM   Specimen: Nasopharyngeal Swab; Nasopharyngeal(NP) swabs in vial transport medium  Result Value Ref Range Status   SARS Coronavirus 2 by RT PCR NEGATIVE NEGATIVE Final    Comment: (NOTE) SARS-CoV-2 target nucleic acids are NOT DETECTED.  The SARS-CoV-2 RNA is generally detectable in upper respiratory specimens during the acute phase of infection. The lowest concentration of SARS-CoV-2 viral copies this assay can detect is 138 copies/mL. A negative result does not preclude SARS-Cov-2 infection and should not be used as the sole basis for treatment or other patient management decisions. A negative result may occur with  improper specimen collection/handling, submission of specimen other than nasopharyngeal swab, presence of viral mutation(s) within the areas targeted by this assay, and inadequate number of viral copies(<138 copies/mL). A negative result must be combined with clinical observations, patient history, and epidemiological information. The expected result is Negative.  Fact Sheet for Patients:  06/30/21  Fact Sheet for Healthcare Providers:   BloggerCourse.com  This test is no t yet approved or cleared by the [place] FDA and  has been authorized for detection and/or diagnosis of SARS-CoV-2 by FDA under an Emergency Use Authorization (EUA). This EUA will remain  in effect (meaning this test can be used) for the duration of the COVID-19 declaration under Section 564(b)(1) of the Act, 21 U.S.C.section 360bbb-3(b)(1), unless the authorization is terminated  or revoked sooner.       Influenza A by PCR NEGATIVE NEGATIVE Final   Influenza B by PCR NEGATIVE NEGATIVE Final    Comment: (NOTE) The Xpert Xpress SARS-CoV-2/FLU/RSV plus assay is intended as an aid in the diagnosis of influenza from Nasopharyngeal swab specimens and should not be used as a sole basis for treatment. Nasal washings and aspirates are unacceptable  for Xpert Xpress SARS-CoV-2/FLU/RSV testing.  Fact Sheet for Patients: BloggerCourse.com  Fact Sheet for Healthcare Providers: SeriousBroker.it  This test is not yet approved or cleared by the [place] FDA and has been authorized for detection and/or diagnosis of SARS-CoV-2 by FDA under an Emergency Use Authorization (EUA). This EUA will remain in effect (meaning this test can be used) for the duration of the COVID-19 declaration under Section 564(b)(1) of the Act, 21 U.S.C. section 360bbb-3(b)(1), unless the authorization is terminated or revoked.  Performed at [facility], [address] [place], [place] [address]          Radiology Studies: No results found.      Scheduled Meds: . (feeding supplement) PROSource Plus  30 mL Oral BID BM  . sodium chloride   Intravenous Once  . amLODipine  10 mg Oral Daily  . Chlorhexidine Gluconate Cloth  6 each Topical Daily  . docusate sodium  100 mg Oral BID  . enoxaparin (LOVENOX) injection  30 mg Subcutaneous Q24H  . famotidine  20 mg Oral Daily  .  metoprolol succinate  50 mg Oral Daily  . mirtazapine  7.5 mg Oral QHS  . multivitamin with minerals  1 tablet Oral Daily  . pantoprazole  40 mg Oral Daily  . polyethylene glycol  17 g Oral Daily  . senna  1 tablet Oral BID  . traMADol  50 mg Oral Q6H   Continuous Infusions: . methocarbamol (ROBAXIN) IV       LOS: 4 days    Time spent: 32 mins     Charise Killian, MD [facility] Pager [phone]  If 7PM-7AM, please contact night-coverage 05/04/2021, 8:02 AM

## 2021-05-04 NOTE — TOC Progression Note (Addendum)
Transition of Care [facility]) - Progression Note    Patient Details  Name: INZA MIKRUT MRN: [id number] Date of Birth: 05-28-1921  Transition of Care [facility]) CM/SW [place], RN Phone Number: 05/04/2021, 12:54 PM  Clinical Narrative:   Met with the patient and she was a bit confused, Called her daughter Oleta Mouse and reviewed the bed offer with the daughter, she chose [facility], I notified Kenney Houseman and accepted in the hub as well , called [employer] to start ins, ref number [id number], uploaded clinical to the portal      Expected Discharge Plan and Services                                                 Social Determinants of Health (SDOH) Interventions    Readmission Risk Interventions No flowsheet data found.

## 2021-05-05 LAB — BASIC METABOLIC PANEL
Anion gap: 9 (ref 5–15)
BUN: 23 mg/dL (ref 8–23)
CO2: 26 mmol/L (ref 22–32)
Calcium: 10.2 mg/dL (ref 8.9–10.3)
Chloride: 98 mmol/L (ref 98–111)
Creatinine, Ser: 0.84 mg/dL (ref 0.44–1.00)
GFR, Estimated: >60 mL/min (ref >60)
Glucose, Bld: 87 mg/dL (ref 70–99)
Potassium: 3.4 mmol/L — ABNORMAL LOW (ref 3.5–5.1)
Sodium: 133 mmol/L — ABNORMAL LOW (ref 135–145)

## 2021-05-05 LAB — CBC
HCT: 26.1 % — ABNORMAL LOW (ref 36.0–46.0)
Hemoglobin: 8.4 g/dL — ABNORMAL LOW (ref 12.0–15.0)
MCH: 28.4 pg (ref 26.0–34.0)
MCHC: 32.2 g/dL (ref 30.0–36.0)
MCV: 88.2 fL (ref 80.0–100.0)
Platelets: 222 10*3/uL (ref 150–400)
RBC: 2.96 MIL/uL — ABNORMAL LOW (ref 3.87–5.11)
RDW: 13.5 % (ref 11.5–15.5)
WBC: 9.1 10*3/uL (ref 4.0–10.5)
nRBC: 0 % (ref 0.0–0.2)

## 2021-05-05 LAB — RESP PANEL BY RT-PCR (FLU A&B, COVID) ARPGX2
Influenza A by PCR: NEGATIVE
Influenza B by PCR: NEGATIVE
SARS Coronavirus 2 by RT PCR: NEGATIVE

## 2021-05-05 MED ORDER — POTASSIUM CHLORIDE CRYS ER 20 MEQ PO TBCR
20.0000 meq | EXTENDED_RELEASE_TABLET | Freq: Once | ORAL | Status: AC
  Administered 2021-05-05: 20 meq via ORAL
  Filled 2021-05-05: qty 1

## 2021-05-05 NOTE — Progress Notes (Signed)
Physical Therapy Treatment Patient Details Name: Kimberly Stokes MRN: [id number] DOB: 1921-03-13 Today's Date: 05/05/2021    History of Present Illness presented to ER secondary to mechanical fall in home environment; admitted for management of L intertrochanteric hip fracture, s/p IM nailing (05/11/21), WBAT.    PT Comments    Pt up with OT this am.  Sleeping in chair.  Family in and requested she be left to rest this am.  Returned in PM.  Pt up in chair for several hours and ready to return to bed.  Stood with min a x 1 and increased time for transitions.  She attempts to walk to commode at end of bed but given pain, she is unable to step.  Sat and stand pivot to bedside commode.  After voiding and care, she stands again and stand pivot to bed with slow steps.  Returned to supine with mod a x 1 for LE assist.     Follow Up Recommendations  SNF     Equipment Recommendations  Rolling [place] with 5" wheels;3in1 (PT)    Recommendations for Other Services       Precautions / Restrictions Precautions Precautions: Fall Restrictions Weight Bearing Restrictions: Yes LLE Weight Bearing: Weight bearing as tolerated    Mobility  Bed Mobility Overal bed mobility: Needs Assistance Bed Mobility: Sit to Supine     Supine to sit: Min assist Sit to supine: Mod assist   General bed mobility comments: good effort but needs help with BLE    Transfers Overall transfer level: Needs assistance Equipment used: Rolling Rase (2 wheeled);None Transfers: Sit to/from [employer] to Stand: Min assist Stand pivot transfers: Min assist       General transfer comment: slow but again good effort  Ambulation/Gait             General Gait Details: unable today   Stairs             Wheelchair Mobility    Modified Rankin (Stroke Patients Only)       Balance Overall balance assessment: Needs assistance Sitting-balance support: No upper extremity  supported;Feet supported Sitting balance-Leahy Scale: Good     Standing balance support: Bilateral upper extremity supported Standing balance-Leahy Scale: Poor Standing balance comment: high fall risk                            Cognition Arousal/Alertness: Awake/alert Behavior During Therapy: WFL for tasks assessed/performed Overall Cognitive Status: History of cognitive impairments - at baseline                                 General Comments: Disoriented to time/place/situation      Exercises Other Exercises Other Exercises: to commode to void Other Exercises: supine<sit<stand, Min A. Seated therex. DC planning with daughter    General Comments        Pertinent Vitals/Pain Pain Assessment: Faces Faces Pain Scale: Hurts even more Pain Location: L hip pain with movements Pain Descriptors / Indicators: Aching;Grimacing;Guarding Pain Intervention(s): Limited activity within patient's tolerance;Monitored during session;Repositioned    Home Living                      Prior Function            PT Goals (current goals can now be found in the care plan section) Acute Rehab PT  Goals Patient Stated Goal: to get back home Progress towards PT goals: Progressing toward goals    Frequency    BID      PT Plan Current plan remains appropriate    Co-evaluation              AM-PAC PT "6 Clicks" Mobility   Outcome Measure  Help needed turning from your back to your side while in a flat bed without using bedrails?: A Little Help needed moving from lying on your back to sitting on the side of a flat bed without using bedrails?: A Little Help needed moving to and from a bed to a chair (including a wheelchair)?: A Lot Help needed standing up from a chair using your arms (e.g., wheelchair or bedside chair)?: A Little Help needed to walk in hospital room?: A Lot Help needed climbing 3-5 steps with a railing? : Total 6 Click Score:  14    End of Session Equipment Utilized During Treatment: Gait belt Activity Tolerance: Patient tolerated treatment well;Patient limited by pain Patient left: with call bell/phone within reach;in chair;in bed;with family/visitor present;with bed alarm set Nurse Communication: Mobility status PT Visit Diagnosis: Muscle weakness (generalized) (M62.81);Difficulty in walking, not elsewhere classified (R26.2);Pain Pain - Right/Left: Left Pain - part of body: Hip     Time: 1430-1455 PT Time Calculation (min) (ACUTE ONLY): 25 min  Charges:  $Therapeutic Activity: 23-37 mins                    Danielle Dess, PTA 05/05/21, 3:32 PM'

## 2021-05-05 NOTE — Progress Notes (Signed)
Occupational Therapy Treatment Patient Details Name: Kimberly Stokes MRN: [id number] DOB: [birth year] Today's Date: 05/05/2021    History of present illness presented to ER secondary to mechanical fall in home environment; admitted for management of L intertrochanteric hip fracture, s/p IM nailing (05/11/21), WBAT.   OT comments  Kimberly Stokes made a good effort today, was able to follow single-step directions, could engage in bed mobility and transfers with MinA. Able to participate in seated UE therex without LOB. Required Min A and close supervision for safety while standing/transfering with RW. Pt not oriented to time or place, but aware she has had a fall and "hurt" her leg. One instance when pt became quite agitated, but she calmed quickly with some soothing words from therapist. Per daughter, family anticipates pt going for STR before returning home with increased supervision.    Follow Up Recommendations  SNF    Equipment Recommendations       Recommendations for Other Services      Precautions / Restrictions Precautions Precautions: Fall Restrictions Weight Bearing Restrictions: Yes LLE Weight Bearing: Weight bearing as tolerated       Mobility Bed Mobility Overal bed mobility: Needs Assistance Bed Mobility: Supine to Sit     Supine to sit: Min assist     General bed mobility comments: Min A, increased time, but pt makes good effort to move supine<EOB sit    Transfers Overall transfer level: Needs assistance Equipment used: Rolling [place]ot (2 wheeled) Transfers: Sit to/from [employer] Sit to Stand: Min assist Stand pivot transfers: Min assist            Balance Overall balance assessment: Needs assistance Sitting-balance support: No upper extremity supported;Feet supported Sitting balance-Kimberly Stokes: Good     Standing balance support: Bilateral upper extremity supported Standing balance-Kimberly Stokes: Fair                              ADL either performed or assessed with clinical judgement   ADL Overall ADL's : Needs assistance/impaired Eating/Feeding: Supervision/ safety Eating/Feeding Details (indicate cue type and reason): Requires encouragement, extended time, to bring food/drink to mouth                                         Vision Patient Visual Report: No change from baseline     Perception     Praxis      Cognition Arousal/Alertness: Awake/alert Behavior During Therapy: WFL for tasks assessed/performed Overall Cognitive Status: History of cognitive impairments - at baseline                                 General Comments: Disoriented to time/place/situation        Exercises Other Exercises Other Exercises: supine<sit<stand, Min A. Seated therex. DC planning with daughter   Shoulder Instructions       General Comments      Pertinent Vitals/ Pain       Faces Pain Stokes: Hurts little more Pain Location: L hip pain with movements Pain Descriptors / Indicators: Aching;Grimacing;Guarding Pain Intervention(s): Limited activity within patient's tolerance;Monitored during session;Repositioned  Home Living  Prior Functioning/Environment              Frequency  Min 2X/week        Progress Toward Goals  OT Goals(current goals can now be found in the care plan section)  Progress towards OT goals: Progressing toward goals  Acute Rehab OT Goals Patient Stated Goal: to get back home OT Goal Formulation: With patient/family Time For Goal Achievement: 05/16/21 Potential to Achieve Goals: Good  Plan Discharge plan remains appropriate;Frequency remains appropriate    Co-evaluation                 AM-PAC OT "6 Clicks" Daily Activity     Outcome Measure   Help from another person eating meals?: A Little Help from another person taking care of personal grooming?: A Little Help  from another person toileting, which includes using toliet, bedpan, or urinal?: A Lot Help from another person bathing (including washing, rinsing, drying)?: A Lot Help from another person to put on and taking off regular upper body clothing?: A Little Help from another person to put on and taking off regular lower body clothing?: A Lot 6 Click Score: 15    End of Session Equipment Utilized During Treatment: Rolling Butt  OT Visit Diagnosis: Unsteadiness on feet (R26.81);Muscle weakness (generalized) (M62.81);Pain Pain - Right/Left: Left Pain - part of body: Leg   Activity Tolerance Patient tolerated treatment well   Patient Left in chair;with call bell/phone within reach;with nursing/sitter in room;with family/visitor present   Nurse Communication          Time: 1030-1057 OT Time Calculation (min): 27 min  Charges: OT General Charges $OT Visit: 1 Visit OT Treatments $Self Care/Home Management : 23-37 mins   Latina Craver, PhD, MS, OTR/L 05/05/21, 12:35 PM

## 2021-05-05 NOTE — TOC Progression Note (Signed)
Transition of Care [facility]) - Progression Note    Patient Details  Name: Kimberly Stokes MRN: [id number] Date of Birth: [birth year]  Transition of Care [facility]) CM/SW Contact  Barrie Dunker, RN Phone Number: 05/05/2021, 9:17 AM  Clinical Narrative:    Received notification from [facility] with Approval for STR at [facility], [id number], ref number [id number] Next review date 6/8         Expected Discharge Plan and Services                                                 Social Determinants of Health (SDOH) Interventions    Readmission Risk Interventions No flowsheet data found.

## 2021-05-05 NOTE — Care Management Important Message (Signed)
Important Message  Patient Details  Name: Kimberly Stokes MRN: [id number] Date of Birth: [birth year]   Medicare Important Message Given:  N/A - LOS <3 / Initial given by admissions     Olegario Messier A Buck Mcaffee 05/05/2021, 9:35 AM

## 2021-05-05 NOTE — Progress Notes (Signed)
PROGRESS NOTE    Kimberly Stokes  WUX:[id number] DOB: August 23, 1921 DOA: 04/30/2021 PCP: Hillery Aldo, MD   Assessment & Plan:   Principal Problem:   Femur fracture, left (HCC) Active Problems:   Hypertension   Arthritis   CKD (chronic kidney disease) stage 3, GFR 30-59 ml/min (HCC)   Hypercalcemia   Left femur fracture: secondary to fall at home. Norco, morphine prn. S/p intramedullary fixation for left intertrochanteric hip fracture on 05/01/21 as per ortho surg. PT/OT recs SNF. Can likely d/c to SNF tomorrow if sitter free for 24 hours   HTN: continue on metoprolol, amlodipine   CKDIIIa: Cr is w/in baseline range   Likely ACD: likely secondary to CKD w/ component of acute blood loss anemia from surgery as stated above. S/p 1 unit of pRBCs transfused on 05/03/21. H&H are stable   Hypokalemia: KCl repleated. Will continue to monitor   Thrombocytopenia: resolved   Hypercalcemia: resolved   Hyperglycemia:  Resolved   DVT prophylaxis: lovenox  Code Status: full  Family Communication: discussed pt's care w/ pt's daughter, [name], and answered her questions Disposition Plan: likely d/c to SNF   Level of care: Med-Surg   Status is: Inpatient  Remains inpatient appropriate because:Ongoing diagnostic testing needed not appropriate for outpatient work up, Unsafe d/c plan, IV treatments appropriate due to intensity of illness or inability to take PO and Inpatient level of care appropriate due to severity of illness, will likely d/c to SNF tomorrow if sitter free for 24 hours    Dispo: The patient is from: Home              Anticipated d/c is to: SNF              Patient currently is medically stable for d/c    Difficult to place patient : unclear    Consultants:   Ortho surg    Procedures:    Antimicrobials:    Subjective: Pt denies any pain. Pt c/o malaise   Objective: Vitals:   05/04/21 0446 05/04/21 0730 05/04/21 1246 05/04/21 2028  BP: (!) 119/53 (!) 115/56  138/61 (!) 149/69  Pulse: 66 72 75 80  Resp: 17 18 18 17   Temp: 98.1 F (36.7 C)   98 F (36.7 C)  TempSrc: Oral   Oral  SpO2: 98% 100% 93% 100%  Weight:      Height:        Intake/Output Summary (Last 24 hours) at 05/05/2021 0750 Last data filed at 05/04/2021 1840 Gross per 24 hour  Intake 0 ml  Output --  Net 0 ml   Filed Weights   04/30/21 1238 05/01/21 1055  Weight: 50 kg 50 kg    Examination:  General exam: Appears calm & comfortable  Respiratory system: clear breath sounds b/l  Cardiovascular system: S1 & S2+. No clicks or rubs   Gastrointestinal system: Abd is soft, NT, ND & normal bowel sounds  Central nervous system:  Alert and awake. Moves all extremities  Psychiatry: judgement and insight appear abnormal. Flat mood and affect    Data Reviewed: I have personally reviewed following labs and imaging studies  CBC: Recent Labs  Lab 04/30/21 1236 05/01/21 0610 05/02/21 0623 05/03/21 0523 05/03/21 1922 05/04/21 0619 05/05/21 0512  WBC 5.2 7.9 7.9 8.3  --  9.5 9.1  NEUTROABS 3.5  --   --   --   --   --   --   HGB 10.9* 10.3* 7.8* 6.8* 8.0* 8.8* 8.4*  HCT 34.0* 32.5* 24.1* 21.0* 24.6* 26.8* 26.1*  MCV 89.0 87.4 86.7 87.1  --  87.3 88.2  PLT 199 189 144* 121*  --  159 222   Basic Metabolic Panel: Recent Labs  Lab 05/01/21 0610 05/02/21 0623 05/03/21 0523 05/04/21 0619 05/05/21 0512  NA 139 135 134* 132* 133*  K 4.0 4.0 3.8 3.8 3.4*  CL 103 101 103 97* 98  CO2 29 27 26 27 26   GLUCOSE 110* 103* 90 97 87  BUN 24* 25* 25* 20 23  CREATININE 0.92 1.05* 1.16* 0.90 0.84  CALCIUM 10.6* 9.6 9.4 10.1 10.2   GFR: Estimated Creatinine Clearance: 30.2 mL/min (by C-G formula based on SCr of 0.84 mg/dL). Liver Function Tests: Recent Labs  Lab 04/30/21 1236  AST 24  ALT 12  ALKPHOS 44  BILITOT 0.5  PROT 6.1*  ALBUMIN 3.6   No results for input(s): LIPASE, AMYLASE in the last 168 hours. No results for input(s): AMMONIA in the last 168  hours. Coagulation Profile: No results for input(s): INR, PROTIME in the last 168 hours. Cardiac Enzymes: No results for input(s): CKTOTAL, CKMB, CKMBINDEX, TROPONINI in the last 168 hours. BNP (last 3 results) No results for input(s): PROBNP in the last 8760 hours. HbA1C: No results for input(s): HGBA1C in the last 72 hours. CBG: No results for input(s): GLUCAP in the last 168 hours. Lipid Profile: No results for input(s): CHOL, HDL, LDLCALC, TRIG, CHOLHDL, LDLDIRECT in the last 72 hours. Thyroid Function Tests: No results for input(s): TSH, T4TOTAL, FREET4, T3FREE, THYROIDAB in the last 72 hours. Anemia Panel: No results for input(s): VITAMINB12, FOLATE, FERRITIN, TIBC, IRON, RETICCTPCT in the last 72 hours. Sepsis Labs: No results for input(s): PROCALCITON, LATICACIDVEN in the last 168 hours.  Recent Results (from the past 240 hour(s))  Resp Panel by RT-PCR (Flu A&B, Covid) Nasopharyngeal Swab     Status: None   Collection Time: 04/30/21  1:59 PM   Specimen: Nasopharyngeal Swab; Nasopharyngeal(NP) swabs in vial transport medium  Result Value Ref Range Status   SARS Coronavirus 2 by RT PCR NEGATIVE NEGATIVE Final    Comment: (NOTE) SARS-CoV-2 target nucleic acids are NOT DETECTED.  The SARS-CoV-2 RNA is generally detectable in upper respiratory specimens during the acute phase of infection. The lowest concentration of SARS-CoV-2 viral copies this assay can detect is 138 copies/mL. A negative result does not preclude SARS-Cov-2 infection and should not be used as the sole basis for treatment or other patient management decisions. A negative result may occur with  improper specimen collection/handling, submission of specimen other than nasopharyngeal swab, presence of viral mutation(s) within the areas targeted by this assay, and inadequate number of viral copies(<138 copies/mL). A negative result must be combined with clinical observations, patient history, and  epidemiological information. The expected result is Negative.  Fact Sheet for Patients:  06/30/21  Fact Sheet for Healthcare Providers:  BloggerCourse.com  This test is no t yet approved or cleared by the [place] FDA and  has been authorized for detection and/or diagnosis of SARS-CoV-2 by FDA under an Emergency Use Authorization (EUA). This EUA will remain  in effect (meaning this test can be used) for the duration of the COVID-19 declaration under Section 564(b)(1) of the Act, 21 U.S.C.section 360bbb-3(b)(1), unless the authorization is terminated  or revoked sooner.       Influenza A by PCR NEGATIVE NEGATIVE Final   Influenza B by PCR NEGATIVE NEGATIVE Final    Comment: (NOTE) The Xpert Xpress  SARS-CoV-2/FLU/RSV plus assay is intended as an aid in the diagnosis of influenza from Nasopharyngeal swab specimens and should not be used as a sole basis for treatment. Nasal washings and aspirates are unacceptable for Xpert Xpress SARS-CoV-2/FLU/RSV testing.  Fact Sheet for Patients: BloggerCourse.com  Fact Sheet for Healthcare Providers: Serious[employer]ker.it  This test is not yet approved or cleared by the [place] FDA and has been authorized for detection and/or diagnosis of SARS-CoV-2 by FDA under an Emergency Use Authorization (EUA). This EUA will remain in effect (meaning this test can be used) for the duration of the COVID-19 declaration under Section 564(b)(1) of the Act, 21 U.S.C. section 360bbb-3(b)(1), unless the authorization is terminated or revoked.  Performed at [facility], [address], [place], [place] [address]          Radiology Studies: No results found.      Scheduled Meds: . (feeding supplement) PROSource Plus  30 mL Oral BID BM  . sodium chloride   Intravenous Once  . amLODipine  10 mg Oral Daily  . Chlorhexidine  Gluconate Cloth  6 each Topical Daily  . docusate sodium  100 mg Oral BID  . enoxaparin (LOVENOX) injection  30 mg Subcutaneous Q24H  . famotidine  20 mg Oral Daily  . metoprolol succinate  50 mg Oral Daily  . mirtazapine  7.5 mg Oral QHS  . multivitamin with minerals  1 tablet Oral Daily  . pantoprazole  40 mg Oral Daily  . polyethylene glycol  17 g Oral Daily  . senna  1 tablet Oral BID  . traMADol  50 mg Oral Q6H   Continuous Infusions: . methocarbamol (ROBAXIN) IV       LOS: 5 days    Time spent: 30 mins     Charise Killian, MD [facility] Pager [phone]  If 7PM-7AM, please contact night-coverage 05/05/2021, 7:50 AM

## 2021-05-05 NOTE — Progress Notes (Signed)
  Subjective:  POD #4 s/p intermedullary fixation for left intertrochanteric hip fracture.   Patient reports left pain as mild to moderate.  Patient is up out of bed to a chair today.  A family member is at the bedside.  Objective:   VITALS:   Vitals:   05/04/21 0730 05/04/21 1246 05/04/21 2028 05/05/21 0854  BP: (!) 115/56 138/61 (!) 149/69 131/63  Pulse: 72 75 80 69  Resp: 18 18 17 18   Temp:   98 F (36.7 C) 97.9 F (36.6 C)  TempSrc:   Oral   SpO2: 100% 93% 100% 100%  Weight:      Height:        PHYSICAL EXAM: Left lower extremity Neurovascular intact Sensation intact distally Intact pulses distally Dorsiflexion/Plantar flexion intact Incision: no drainage No cellulitis present Compartment soft  LABS  Results for orders placed or performed during the hospital encounter of 04/30/21 (from the past 24 hour(s))  CBC     Status: Abnormal   Collection Time: 05/05/21  5:12 AM  Result Value Ref Range   WBC 9.1 4.0 - 10.5 K/uL   RBC 2.96 (L) 3.87 - 5.11 MIL/uL   Hemoglobin 8.4 (L) 12.0 - 15.0 g/dL   HCT 07/05/21 (L) 93.7 - 34.2 %   MCV 88.2 80.0 - 100.0 fL   MCH 28.4 26.0 - 34.0 pg   MCHC 32.2 30.0 - 36.0 g/dL   RDW 87.6 81.1 - 57.2 %   Platelets 222 150 - 400 K/uL   nRBC 0.0 0.0 - 0.2 %  Basic metabolic panel     Status: Abnormal   Collection Time: 05/05/21  5:12 AM  Result Value Ref Range   Sodium 133 (L) 135 - 145 mmol/L   Potassium 3.4 (L) 3.5 - 5.1 mmol/L   Chloride 98 98 - 111 mmol/L   CO2 26 22 - 32 mmol/L   Glucose, Bld 87 70 - 99 mg/dL   BUN 23 8 - 23 mg/dL   Creatinine, Ser 07/05/21 0.44 - 1.00 mg/dL   Calcium 3.55 8.9 - 97.4 mg/dL   GFR, Estimated 16.3 >84 mL/min   Anion gap 9 5 - 15  Resp Panel by RT-PCR (Flu A&B, Covid) Nasopharyngeal Swab     Status: None   Collection Time: 05/05/21 12:39 PM   Specimen: Nasopharyngeal Swab; Nasopharyngeal(NP) swabs in vial transport medium  Result Value Ref Range   SARS Coronavirus 2 by RT PCR NEGATIVE NEGATIVE    Influenza A by PCR NEGATIVE NEGATIVE   Influenza B by PCR NEGATIVE NEGATIVE    No results found.  Assessment/Plan: 4 Days Post-Op   Principal Problem:   Femur fracture, left (HCC) Active Problems:   Hypertension   Arthritis   CKD (chronic kidney disease) stage 3, GFR 30-59 ml/min (HCC)   Hypercalcemia  Continue physical therapy.  Patient is weightbearing as tolerated on left lower extremity.  Patient will need a skilled nursing facility upon discharge.  Patient is likely being discharged to skilled nursing facility tomorrow.  The patient to be discharged on Lovenox and remain on it while at skilled nursing facility.  She will follow-up in my office in 10 to 14 days after discharge for wound check, staple removal and x-ray.   07/05/21 , MD 05/05/2021, 2:36 PM

## 2021-05-06 LAB — BASIC METABOLIC PANEL
Anion gap: 7 (ref 5–15)
BUN: 22 mg/dL (ref 8–23)
CO2: 28 mmol/L (ref 22–32)
Calcium: 10.2 mg/dL (ref 8.9–10.3)
Chloride: 97 mmol/L — ABNORMAL LOW (ref 98–111)
Creatinine, Ser: 0.87 mg/dL (ref 0.44–1.00)
GFR, Estimated: >60 mL/min (ref >60)
Glucose, Bld: 94 mg/dL (ref 70–99)
Potassium: 3.9 mmol/L (ref 3.5–5.1)
Sodium: 132 mmol/L — ABNORMAL LOW (ref 135–145)

## 2021-05-06 LAB — CBC
HCT: 22.4 % — ABNORMAL LOW (ref 36.0–46.0)
Hemoglobin: 7.4 g/dL — ABNORMAL LOW (ref 12.0–15.0)
MCH: 28.6 pg (ref 26.0–34.0)
MCHC: 33 g/dL (ref 30.0–36.0)
MCV: 86.5 fL (ref 80.0–100.0)
Platelets: 247 10*3/uL (ref 150–400)
RBC: 2.59 MIL/uL — ABNORMAL LOW (ref 3.87–5.11)
RDW: 13.6 % (ref 11.5–15.5)
WBC: 7.1 10*3/uL (ref 4.0–10.5)
nRBC: 0 % (ref 0.0–0.2)

## 2021-05-06 MED ORDER — TRAMADOL HCL 50 MG PO TABS: 50.0000 mg | ORAL_TABLET | Freq: Four times a day (QID) | ORAL | 0 refills | Status: AC | PRN

## 2021-05-06 MED ORDER — FAMOTIDINE 20 MG PO TABS: 40.0000 mg | ORAL_TABLET | Freq: Every day | ORAL | Status: DC

## 2021-05-06 MED ORDER — ENOXAPARIN SODIUM 30 MG/0.3ML IJ SOSY: 30.0000 mg | PREFILLED_SYRINGE | INTRAMUSCULAR | Status: DC

## 2021-05-06 MED ORDER — FAMOTIDINE 40 MG PO TABS: 40.0000 mg | ORAL_TABLET | Freq: Every day | ORAL | Status: DC

## 2021-05-06 NOTE — Progress Notes (Signed)
Pt discharged to [facility] with family present; all instructions provided with questions answered.  Report attempted with no answer.   BP (!) 103/58 (BP Location: Left Arm)   Pulse 74   Temp 98.5 F (36.9 C) (Oral)   Resp 16   Ht 5\' 2"  (1.575 m)   Wt 50 kg   SpO2 100%   BMI 20.16 kg/m

## 2021-05-06 NOTE — Progress Notes (Signed)
Informed daughter and patient that the on-call physician advised to give morphine for patient's chest pain. The daughter declined reporting patient does not have any pain now. She reports giving patient ginger ale and she belched. Confirmed with patient that chest pain has resolved.

## 2021-05-06 NOTE — Consult Note (Signed)
  Subjective:  POD #5 s/p intramedullary fixation for left intertrochanteric hip fracture.   Patient sleeping comfortably.  Her family is at the bedside.  Objective:   VITALS:   Vitals:   05/05/21 2359 05/06/21 0605 05/06/21 0756 05/06/21 1155  BP: (!) 144/73 132/72 120/65 (!) 107/58  Pulse: 86 80 75 70  Resp: (!) 24  16 16   Temp: 98.7 F (37.1 C) 98.7 F (37.1 C) 98.4 F (36.9 C) 98.9 F (37.2 C)  TempSrc: Oral Oral Oral Oral  SpO2: 98% 98% 100% 100%  Weight:      Height:        PHYSICAL EXAM: Left lower extremity: Patient was not awoken for examination today.  I examined her left thigh and dressings.  She had palpable pedal pulses. Intact pulses distally Incision: no drainage No cellulitis present Compartment soft  LABS  Results for orders placed or performed during the hospital encounter of 04/30/21 (from the past 24 hour(s))  CBC     Status: Abnormal   Collection Time: 05/06/21  4:57 AM  Result Value Ref Range   WBC 7.1 4.0 - 10.5 K/uL   RBC 2.59 (L) 3.87 - 5.11 MIL/uL   Hemoglobin 7.4 (L) 12.0 - 15.0 g/dL   HCT 07/06/21 (L) 27.2 - 53.6 %   MCV 86.5 80.0 - 100.0 fL   MCH 28.6 26.0 - 34.0 pg   MCHC 33.0 30.0 - 36.0 g/dL   RDW 64.4 03.4 - 74.2 %   Platelets 247 150 - 400 K/uL   nRBC 0.0 0.0 - 0.2 %  Basic metabolic panel     Status: Abnormal   Collection Time: 05/06/21  4:57 AM  Result Value Ref Range   Sodium 132 (L) 135 - 145 mmol/L   Potassium 3.9 3.5 - 5.1 mmol/L   Chloride 97 (L) 98 - 111 mmol/L   CO2 28 22 - 32 mmol/L   Glucose, Bld 94 70 - 99 mg/dL   BUN 22 8 - 23 mg/dL   Creatinine, Ser 07/06/21 0.44 - 1.00 mg/dL   Calcium 6.38 8.9 - 75.6 mg/dL   GFR, Estimated 43.3 >29 mL/min   Anion gap 7 5 - 15    No results found.  Assessment/Plan: 5 Days Post-Op   Principal Problem:   Femur fracture, left (HCC) Active Problems:   Hypertension   Arthritis   CKD (chronic kidney disease) stage 3, GFR 30-59 ml/min (HCC)   Hypercalcemia  Patient stable from  an orthopedic standpoint.  She is weightbearing as tolerated on left lower extremity.  She will continue with physical therapy.  Patient will need a skilled nursing facility upon discharge.  Patient should continue Lovenox daily for DVT prophylaxis while at her skilled nursing facility.  Patient should follow-up at [facility] in [place] in 10 to 14 days for staple removal, wound check and x-ray.    >51 , MD 05/06/2021, 2:41 PM

## 2021-05-06 NOTE — TOC Progression Note (Signed)
Transition of Care [facility]) - Progression Note    Patient Details  Name: Kimberly Stokes MRN: [id number] Date of Birth: August 23, 1921  Transition of [facility]) CM/SW Contact  Barrie Dunker, RN Phone Number: 05/06/2021, 3:17 PM  Clinical Narrative:   Bedside nurse to call report to [facility] [phone], [facility] to pick up the patient at 5 PM to transport, the grand daughter and daughter is in the room and aware         Expected Discharge Plan and Services           Expected Discharge Date: 05/06/21                                     Social Determinants of Health (SDOH) Interventions    Readmission Risk Interventions No flowsheet data found.

## 2021-05-06 NOTE — Care Management Important Message (Signed)
Important Message  Patient Details  Name: Kimberly Stokes MRN: [id number] Date of Birth: December 03, 1920   Medicare Important Message Given:  N/A - LOS <3 / Initial given by admissions     [name] 05/06/2021, 1:59 PM

## 2021-05-06 NOTE — Progress Notes (Signed)
Report attempted at this time with no answer.

## 2021-05-06 NOTE — Plan of Care (Signed)
  Problem: Clinical Measurements: Goal: Postoperative complications will be avoided or minimized Outcome: Progressing   Problem: Self-Concept: Goal: Ability to maintain and perform role responsibilities to the fullest extent possible will improve Outcome: Progressing   Problem: Pain Management: Goal: Pain level will decrease Outcome: Progressing   

## 2021-05-06 NOTE — Discharge Summary (Signed)
Physician Discharge Summary  Kimberly Stokes KYH:[id number] DOB: January 13, 1921 DOA: 04/30/2021  PCP: Hillery Aldo, MD  Admit date: 04/30/2021 Discharge date: 05/06/2021  Admitted From: home  Disposition:  SNF  Recommendations for Outpatient Follow-up:  1. Follow up with PCP in 1-2 weeks 2. F/u w/ ortho surg, Dr. Martha Clan, in 10-14 days   Home Health: no  Equipment/Devices:  Discharge Condition: stable  CODE STATUS: full  Diet recommendation: Heart Healthy / soft diet    Brief/Interim Summary: HPI was taken from Dr. Joylene Igo: Kimberly Stokes is a 87 y.o. female with medical history significant for hypertension, mild cognitive deficits and arthritis to the ER for evaluation of left hip pain by EMS.  Patient lives alone and states that she was coming out of the bathroom and fell.  She is unsure of what happened but denies tripping on any object, feeling dizzy or lightheaded.  She was unable to get up due to pain in her hip but crawled to the telephone and called her daughter for help.  Her daughter then called 911. She denies having any chest pain, no shortness of breath, no abdominal pain, no nausea, no vomiting, no changes in her bowel habits, no fever, no chills, no urinary symptoms, no headache, no cough, no blurred vision, no focal deficits. Labs show sodium 142, potassium 3.9, chloride 107, bicarb 28, glucose 89, BUN 31, creatinine 1.19, calcium 11 phosphatase 44, albumin 3.6, AST 24, ALT 12 total protein 6.1: White count 5.2, hemoglobin 10.9, hematocrit 34, MCV 89, RDW 14, platelet count 199 Respiratory viral panel is pending Chest x-ray reviewed by me shows no acute cardiopulmonary disease Left hip x-ray reviewed by me shows slightly angulated left intertrochanteric hip fracture.Diffuse osteopenia. Degenerative change lumbar spine and both Hips. Peripheral vascular disease. Twelve-lead EKG reviewed by me shows sinus rhythm, diffuse T wave inversions LVH.   ED Course: Patient is an  87 year old African-American female who was brought into the ER by EMS for evaluation of left hip pain.  Patient lives alone and fell while coming out of the bathroom this morning. She has a left intertrochanteric hip fracture. Orthopedic surgery has been consulted and plan is for surgical repair in a.m.  Hospital course from Dr. Mayford Knife 6/2-05/06/21: Pt was found to have left femur fracture secondary to fall at home. Pt is s/p intramedullary fixation for the left intertrochanteric hip fracture on 05/01/21 as per ortho surg. Pt was placed on & should remain on lovenox for DVT prophylaxis the entire time the pt is at SNF as per ortho surg. PT/OT evaluated the pt and recommended SNF. Of note, pt did require 1 unit of pRBCs to transfused as Hb dropped to 6.8 but has not required any more transfusions since that time. For more information, please see previous progress/consult notes   Discharge Diagnoses:  Principal Problem:   Femur fracture, left (HCC) Active Problems:   Hypertension   Arthritis   CKD (chronic kidney disease) stage 3, GFR 30-59 ml/min (HCC)   Hypercalcemia  Leftfemur fracture: secondary to fall at home. Tramadol prn for pain . S/p intramedullary fixation for left intertrochanteric hip fracture on 05/01/21 as per ortho surg. PT/OT recs SNF.  HTN: continue on metoprolol, chlorthalidone   CKDIIIa: Cr is w/in baseline range   Likely ACD: likely secondary to CKD w/ component of acute blood loss anemia from surgery as stated above. S/p 1 unit of pRBCs transfused on 05/03/21. H&H are labile   Hypokalemia: WNL today   Thrombocytopenia: resolved  Hypercalcemia: resolved   Hyperglycemia:  Resolved   Discharge Instructions  Discharge Instructions    Diet - low sodium heart healthy   Complete by: As directed    Soft diet   Discharge instructions   Complete by: As directed    F/u w/ PCP in 1-2 weeks. F/u w/ ortho surg, Dr. Martha ClanKrasinski, in 10-14 days   Increase activity slowly    Complete by: As directed    No wound care   Complete by: As directed      Allergies as of 05/06/2021   No Known Allergies     Medication List    TAKE these medications   aspirin 81 MG chewable tablet Chew 81 mg by mouth daily.   chlorthalidone 25 MG tablet Commonly known as: HYGROTON Take 25 mg by mouth daily.   enoxaparin 30 MG/0.3ML injection Commonly known as: LOVENOX Inject 0.3 mLs (30 mg total) into the skin daily. For DVT prophylaxis for femur fracture repair. Should stay on this the entire time pt is in SNF as per ortho surg Start taking on: May 07, 2021   famotidine 40 MG tablet Commonly known as: PEPCID Take 1 tablet (40 mg total) by mouth daily. Start taking on: May 07, 2021 What changed:   medication strength  how much to take  when to take this   metoprolol succinate 25 MG 24 hr tablet Commonly known as: TOPROL-XL Take 25 mg by mouth daily.   mirtazapine 7.5 MG tablet Commonly known as: REMERON Take 7.5 mg by mouth at bedtime.   multivitamin with minerals tablet Take 1 tablet by mouth daily.   omeprazole 20 MG capsule Commonly known as: PRILOSEC Take 20 mg by mouth daily.   traMADol 50 MG tablet Commonly known as: ULTRAM Take 1 tablet (50 mg total) by mouth every 6 (six) hours as needed for up to 1 day for moderate pain or severe pain.       Contact information for follow-up providers    Juanell FairlyKrasinski, Kevin, MD. Go on 05/16/2021.   Specialty: Orthopedic Surgery Why: @ 9:15 am Contact information: [address] [place] [id number] [phone]            Contact information for after-discharge care    Destination    [facility] Preferred SNF .   Service: Skilled Nursing Contact information: [address] [place] [id number] [phone]                 No Known Allergies  Consultations:  Ortho surg, Dr. Martha ClanKrasinski    Procedures/Studies: DG Chest 1 View  Result Date:  04/30/2021 CLINICAL DATA:  Fall EXAM: CHEST  1 VIEW COMPARISON:  2019 FINDINGS: The heart size and mediastinal contours are within normal limits. Both lungs are clear. No pleural effusion. No pneumothorax. The visualized skeletal structures are intact. IMPRESSION: No acute process in the chest Electronically Signed   By: Guadlupe SpanishPraneil  Patel M.D.   On: 04/30/2021 14:01   CT Head Wo Contrast  Result Date: 04/30/2021 CLINICAL DATA:  Head and neck trauma. EXAM: CT HEAD WITHOUT CONTRAST CT CERVICAL SPINE WITHOUT CONTRAST TECHNIQUE: Multidetector CT imaging of the head and cervical spine was performed following the standard protocol without intravenous contrast. Multiplanar CT image reconstructions of the cervical spine were also generated. COMPARISON:  None. FINDINGS: CT HEAD FINDINGS Brain: Generalized mild atrophy. Negative for acute infarct, hemorrhage, mass. Mild white matter hypodensity bilaterally. Vascular: Negative for hyperdense vessel Skull: Negative Sinuses/Orbits: Mucosal edema and  bony thickening right ethmoid sinus. Remaining sinuses clear. Bilateral cataract extraction. Other: None CT CERVICAL SPINE FINDINGS Alignment: Normal Skull base and vertebrae: Negative for fracture Soft tissues and spinal canal: Negative Disc levels: Multilevel disc degeneration. Disc space narrowing and disc calcification is present at multiple levels. Bilateral facet degeneration is present. No significant spinal stenosis. Upper chest: Lung apices clear bilaterally Other: None IMPRESSION: 1. No acute intracranial abnormality 2. Negative for cervical fracture. Electronically Signed   By: Marlan Palau M.D.   On: 04/30/2021 13:36   CT Cervical Spine Wo Contrast  Result Date: 04/30/2021 CLINICAL DATA:  Head and neck trauma. EXAM: CT HEAD WITHOUT CONTRAST CT CERVICAL SPINE WITHOUT CONTRAST TECHNIQUE: Multidetector CT imaging of the head and cervical spine was performed following the standard protocol without intravenous contrast.  Multiplanar CT image reconstructions of the cervical spine were also generated. COMPARISON:  None. FINDINGS: CT HEAD FINDINGS Brain: Generalized mild atrophy. Negative for acute infarct, hemorrhage, mass. Mild white matter hypodensity bilaterally. Vascular: Negative for hyperdense vessel Skull: Negative Sinuses/Orbits: Mucosal edema and bony thickening right ethmoid sinus. Remaining sinuses clear. Bilateral cataract extraction. Other: None CT CERVICAL SPINE FINDINGS Alignment: Normal Skull base and vertebrae: Negative for fracture Soft tissues and spinal canal: Negative Disc levels: Multilevel disc degeneration. Disc space narrowing and disc calcification is present at multiple levels. Bilateral facet degeneration is present. No significant spinal stenosis. Upper chest: Lung apices clear bilaterally Other: None IMPRESSION: 1. No acute intracranial abnormality 2. Negative for cervical fracture. Electronically Signed   By: Marlan Palau M.D.   On: 04/30/2021 13:36   DG HIP OPERATIVE UNILAT W OR W/O PELVIS LEFT  Result Date: 05/01/2021 CLINICAL DATA:  Left femur nail for fracture EXAM: OPERATIVE left HIP (WITH PELVIS IF PERFORMED) 4 VIEWS TECHNIQUE: Fluoroscopic spot image(s) were submitted for interpretation post-operatively. COMPARISON:  04/30/2021 FINDINGS: Left intertrochanteric fracture is been fixed with a compression screw and locking intramedullary nail extending into the distal left femur. Fracture alignment is satisfactory. Hardware position satisfactory IMPRESSION: Satisfactory ORIF left intertrochanteric fracture. Electronically Signed   By: Marlan Palau M.D.   On: 05/01/2021 14:12   DG Hip Unilat W or Wo Pelvis 2-3 Views Left  Result Date: 04/30/2021 CLINICAL DATA:  Fall. EXAM: DG HIP (WITH OR WITHOUT PELVIS) 2-3V LEFT COMPARISON:  CT 06/27/2018. FINDINGS: Slightly angulated left intertrochanteric hip fracture. No evidence of dislocation. Osteopenia. Degenerative changes lumbar spine and both  hips. Peripheral vascular calcification. Pelvic calcifications consistent phleboliths. IMPRESSION: 1.  Slightly angulated left intertrochanteric hip fracture. 2. Diffuse osteopenia. Degenerative change lumbar spine and both hips. 3.  Peripheral vascular disease. Electronically Signed   By: Maisie Fus  Register   On: 04/30/2021 13:54   DG FEMUR PORT MIN 2 VIEWS LEFT  Result Date: 05/01/2021 CLINICAL DATA:  Post LEFT femoral nailing EXAM: LEFT FEMUR PORTABLE 2 VIEWS COMPARISON:  Portable exam 1457 hours compared to intraoperative images of 05/01/2021 FINDINGS: IM nail with compression screw at proximal LEFT femur across a reduced intertrochanteric fracture. Bones appear demineralized. No dislocation. Degenerative changes and chondrocalcinosis at LEFT knee. IMPRESSION: Post nailing of LEFT femoral fracture. Electronically Signed   By: Ulyses Southward M.D.   On: 05/01/2021 15:19       Subjective: Pt c/o fatigue    Discharge Exam: Vitals:   05/06/21 0756 05/06/21 1155  BP: 120/65 (!) 107/58  Pulse: 75 70  Resp: 16 16  Temp: 98.4 F (36.9 C) 98.9 F (37.2 C)  SpO2: 100% 100%   Vitals:  05/05/21 2359 05/06/21 0605 05/06/21 0756 05/06/21 1155  BP: (!) 144/73 132/72 120/65 (!) 107/58  Pulse: 86 80 75 70  Resp: (!) Temp: 98.7 F (37.1 C) 98.7 F (37.1 C) 98.4 F (36.9 C) 98.9 F (37.2 C)  TempSrc: Oral Oral Oral Oral  SpO2: 98% 98% 100% 100%  Weight:      Height:        General: Pt is alert, awake, not in acute distress. Frail appearing  Cardiovascular: S1/S2 +, no rubs, no gallops Respiratory: CTA bilaterally, no wheezing, no rhonchi Abdominal: Soft, NT, ND, bowel sounds + Extremities: no edema, no cyanosis    The results of significant diagnostics from this hospitalization (including imaging, microbiology, ancillary and laboratory) are listed below for reference.     Microbiology: Recent Results (from the past 240 hour(s))  Resp Panel by RT-PCR (Flu A&B, Covid)  Nasopharyngeal Swab     Status: None   Collection Time: 04/30/21  1:59 PM   Specimen: Nasopharyngeal Swab; Nasopharyngeal(NP) swabs in vial transport medium  Result Value Ref Range Status   SARS Coronavirus 2 by RT PCR NEGATIVE NEGATIVE Final    Comment: (NOTE) SARS-CoV-2 target nucleic acids are NOT DETECTED.  The SARS-CoV-2 RNA is generally detectable in upper respiratory specimens during the acute phase of infection. The lowest concentration of SARS-CoV-2 viral copies this assay can detect is 138 copies/mL. A negative result does not preclude SARS-Cov-2 infection and should not be used as the sole basis for treatment or other patient management decisions. A negative result may occur with  improper specimen collection/handling, submission of specimen other than nasopharyngeal swab, presence of viral mutation(s) within the areas targeted by this assay, and inadequate number of viral copies(<138 copies/mL). A negative result must be combined with clinical observations, patient history, and epidemiological information. The expected result is Negative.  Fact Sheet for Patients:  BloggerCourse.com  Fact Sheet for Healthcare Providers:  SeriousBroker.it  This test is no t yet approved or cleared by the [place] FDA and  has been authorized for detection and/or diagnosis of SARS-CoV-2 by FDA under an Emergency Use Authorization (EUA). This EUA will remain  in effect (meaning this test can be used) for the duration of the COVID-19 declaration under Section 564(b)(1) of the Act, 21 U.S.C.section 360bbb-3(b)(1), unless the authorization is terminated  or revoked sooner.       Influenza A by PCR NEGATIVE NEGATIVE Final   Influenza B by PCR NEGATIVE NEGATIVE Final    Comment: (NOTE) The Xpert Xpress SARS-CoV-2/FLU/RSV plus assay is intended as an aid in the diagnosis of influenza from Nasopharyngeal swab specimens and should not be  used as a sole basis for treatment. Nasal washings and aspirates are unacceptable for Xpert Xpress SARS-CoV-2/FLU/RSV testing.  Fact Sheet for Patients: BloggerCourse.com  Fact Sheet for Healthcare Providers: SeriousBroker.it  This test is not yet approved or cleared by the [place] FDA and has been authorized for detection and/or diagnosis of SARS-CoV-2 by FDA under an Emergency Use Authorization (EUA). This EUA will remain in effect (meaning this test can be used) for the duration of the COVID-19 declaration under Section 564(b)(1) of the Act, 21 U.S.C. section 360bbb-3(b)(1), unless the authorization is terminated or revoked.  Performed at [facility], [address] [place], [place] [address]   Resp Panel by RT-PCR (Flu A&B, Covid) Nasopharyngeal Swab     Status: None   Collection Time: 05/05/21 12:39 PM   Specimen: Nasopharyngeal Swab;  Nasopharyngeal(NP) swabs in vial transport medium  Result Value Ref Range Status   SARS Coronavirus 2 by RT PCR NEGATIVE NEGATIVE Final    Comment: (NOTE) SARS-CoV-2 target nucleic acids are NOT DETECTED.  The SARS-CoV-2 RNA is generally detectable in upper respiratory specimens during the acute phase of infection. The lowest concentration of SARS-CoV-2 viral copies this assay can detect is 138 copies/mL. A negative result does not preclude SARS-Cov-2 infection and should not be used as the sole basis for treatment or other patient management decisions. A negative result may occur with  improper specimen collection/handling, submission of specimen other than nasopharyngeal swab, presence of viral mutation(s) within the areas targeted by this assay, and inadequate number of viral copies(<138 copies/mL). A negative result must be combined with clinical observations, patient history, and epidemiological information. The expected result is Negative.  Fact Sheet for Patients:   BloggerCourse.com  Fact Sheet for Healthcare Providers:  SeriousBroker.it  This test is no t yet approved or cleared by the [place] FDA and  has been authorized for detection and/or diagnosis of SARS-CoV-2 by FDA under an Emergency Use Authorization (EUA). This EUA will remain  in effect (meaning this test can be used) for the duration of the COVID-19 declaration under Section 564(b)(1) of the Act, 21 U.S.C.section 360bbb-3(b)(1), unless the authorization is terminated  or revoked sooner.       Influenza A by PCR NEGATIVE NEGATIVE Final   Influenza B by PCR NEGATIVE NEGATIVE Final    Comment: (NOTE) The Xpert Xpress SARS-CoV-2/FLU/RSV plus assay is intended as an aid in the diagnosis of influenza from Nasopharyngeal swab specimens and should not be used as a sole basis for treatment. Nasal washings and aspirates are unacceptable for Xpert Xpress SARS-CoV-2/FLU/RSV testing.  Fact Sheet for Patients: BloggerCourse.com  Fact Sheet for Healthcare Providers: SeriousBroker.it  This test is not yet approved or cleared by the [place] FDA and has been authorized for detection and/or diagnosis of SARS-CoV-2 by FDA under an Emergency Use Authorization (EUA). This EUA will remain in effect (meaning this test can be used) for the duration of the COVID-19 declaration under Section 564(b)(1) of the Act, 21 U.S.C. section 360bbb-3(b)(1), unless the authorization is terminated or revoked.  Performed at [facility], [address] [place], [place] [address]      Labs: BNP (last 3 results) No results for input(s): BNP in the last 8760 hours. Basic Metabolic Panel: Recent Labs  Lab 05/02/21 0623 05/03/21 0523 05/04/21 0619 05/05/21 0512 05/06/21 0457  NA 135 134* 132* 133* 132*  K 4.0 3.8 3.8 3.4* 3.9  CL 101 103 97* 98 97*  CO2 27 26 27 26 28   GLUCOSE 103*  90 97 87 94  BUN 25* 25* 20 23 22   CREATININE 1.05* 1.16* 0.90 0.84 0.87  CALCIUM 9.6 9.4 10.1 10.2 10.2   Liver Function Tests: Recent Labs  Lab 04/30/21 1236  AST 24  ALT 12  ALKPHOS 44  BILITOT 0.5  PROT 6.1*  ALBUMIN 3.6   No results for input(s): LIPASE, AMYLASE in the last 168 hours. No results for input(s): AMMONIA in the last 168 hours. CBC: Recent Labs  Lab 04/30/21 1236 05/01/21 0610 05/02/21 0623 05/03/21 0523 05/03/21 1922 05/04/21 0619 05/05/21 0512 05/06/21 0457  WBC 5.2   < > 7.9 8.3  --  9.5 9.1 7.1  NEUTROABS 3.5  --   --   --   --   --   --   --  HGB 10.9*   < > 7.8* 6.8* 8.0* 8.8* 8.4* 7.4*  HCT 34.0*   < > 24.1* 21.0* 24.6* 26.8* 26.1* 22.4*  MCV 89.0   < > 86.7 87.1  --  87.3 88.2 86.5  PLT 199   < > 144* 121*  --  159 222 247   < > = values in this interval not displayed.   Cardiac Enzymes: No results for input(s): CKTOTAL, CKMB, CKMBINDEX, TROPONINI in the last 168 hours. BNP: Invalid input(s): POCBNP CBG: No results for input(s): GLUCAP in the last 168 hours. D-Dimer No results for input(s): DDIMER in the last 72 hours. Hgb A1c No results for input(s): HGBA1C in the last 72 hours. Lipid Profile No results for input(s): CHOL, HDL, LDLCALC, TRIG, CHOLHDL, LDLDIRECT in the last 72 hours. Thyroid function studies No results for input(s): TSH, T4TOTAL, T3FREE, THYROIDAB in the last 72 hours.  Invalid input(s): FREET3 Anemia work up No results for input(s): VITAMINB12, FOLATE, FERRITIN, TIBC, IRON, RETICCTPCT in the last 72 hours. Urinalysis    Component Value Date/Time   COLORURINE YELLOW (A) 04/30/2021 0500   APPEARANCEUR CLEAR (A) 04/30/2021 0500   LABSPEC 1.017 04/30/2021 0500   PHURINE 6.0 04/30/2021 0500   GLUCOSEU NEGATIVE 04/30/2021 0500   HGBUR NEGATIVE 04/30/2021 0500   BILIRUBINUR NEGATIVE 04/30/2021 0500   KETONESUR NEGATIVE 04/30/2021 0500   PROTEINUR NEGATIVE 04/30/2021 0500   NITRITE NEGATIVE 04/30/2021 0500    LEUKOCYTESUR NEGATIVE 04/30/2021 0500   Sepsis Labs Invalid input(s): PROCALCITONIN,  WBC,  LACTICIDVEN Microbiology Recent Results (from the past 240 hour(s))  Resp Panel by RT-PCR (Flu A&B, Covid) Nasopharyngeal Swab     Status: None   Collection Time: 04/30/21  1:59 PM   Specimen: Nasopharyngeal Swab; Nasopharyngeal(NP) swabs in vial transport medium  Result Value Ref Range Status   SARS Coronavirus 2 by RT PCR NEGATIVE NEGATIVE Final    Comment: (NOTE) SARS-CoV-2 target nucleic acids are NOT DETECTED.  The SARS-CoV-2 RNA is generally detectable in upper respiratory specimens during the acute phase of infection. The lowest concentration of SARS-CoV-2 viral copies this assay can detect is 138 copies/mL. A negative result does not preclude SARS-Cov-2 infection and should not be used as the sole basis for treatment or other patient management decisions. A negative result may occur with  improper specimen collection/handling, submission of specimen other than nasopharyngeal swab, presence of viral mutation(s) within the areas targeted by this assay, and inadequate number of viral copies(<138 copies/mL). A negative result must be combined with clinical observations, patient history, and epidemiological information. The expected result is Negative.  Fact Sheet for Patients:  BloggerCourse.com  Fact Sheet for Healthcare Providers:  SeriousBroker.it  This test is no t yet approved or cleared by the [place] FDA and  has been authorized for detection and/or diagnosis of SARS-CoV-2 by FDA under an Emergency Use Authorization (EUA). This EUA will remain  in effect (meaning this test can be used) for the duration of the COVID-19 declaration under Section 564(b)(1) of the Act, 21 U.S.C.section 360bbb-3(b)(1), unless the authorization is terminated  or revoked sooner.       Influenza A by PCR NEGATIVE NEGATIVE Final   Influenza B  by PCR NEGATIVE NEGATIVE Final    Comment: (NOTE) The Xpert Xpress SARS-CoV-2/FLU/RSV plus assay is intended as an aid in the diagnosis of influenza from Nasopharyngeal swab specimens and should not be used as a sole basis for treatment. Nasal washings and aspirates are unacceptable for Xpert Xpress SARS-CoV-2/FLU/RSV  testing.  Fact Sheet for Patients: BloggerCourse.com  Fact Sheet for Healthcare Providers: SeriousBroker.it  This test is not yet approved or cleared by the [place] FDA and has been authorized for detection and/or diagnosis of SARS-CoV-2 by FDA under an Emergency Use Authorization (EUA). This EUA will remain in effect (meaning this test can be used) for the duration of the COVID-19 declaration under Section 564(b)(1) of the Act, 21 U.S.C. section 360bbb-3(b)(1), unless the authorization is terminated or revoked.  Performed at [facility], [address] [place], [place] [address]   Resp Panel by RT-PCR (Flu A&B, Covid) Nasopharyngeal Swab     Status: None   Collection Time: 05/05/21 12:39 PM   Specimen: Nasopharyngeal Swab; Nasopharyngeal(NP) swabs in vial transport medium  Result Value Ref Range Status   SARS Coronavirus 2 by RT PCR NEGATIVE NEGATIVE Final    Comment: (NOTE) SARS-CoV-2 target nucleic acids are NOT DETECTED.  The SARS-CoV-2 RNA is generally detectable in upper respiratory specimens during the acute phase of infection. The lowest concentration of SARS-CoV-2 viral copies this assay can detect is 138 copies/mL. A negative result does not preclude SARS-Cov-2 infection and should not be used as the sole basis for treatment or other patient management decisions. A negative result may occur with  improper specimen collection/handling, submission of specimen other than nasopharyngeal swab, presence of viral mutation(s) within the areas targeted by this assay, and inadequate number of  viral copies(<138 copies/mL). A negative result must be combined with clinical observations, patient history, and epidemiological information. The expected result is Negative.  Fact Sheet for Patients:  BloggerCourse.com  Fact Sheet for Healthcare Providers:  SeriousBroker.it  This test is no t yet approved or cleared by the [place] FDA and  has been authorized for detection and/or diagnosis of SARS-CoV-2 by FDA under an Emergency Use Authorization (EUA). This EUA will remain  in effect (meaning this test can be used) for the duration of the COVID-19 declaration under Section 564(b)(1) of the Act, 21 U.S.C.section 360bbb-3(b)(1), unless the authorization is terminated  or revoked sooner.       Influenza A by PCR NEGATIVE NEGATIVE Final   Influenza B by PCR NEGATIVE NEGATIVE Final    Comment: (NOTE) The Xpert Xpress SARS-CoV-2/FLU/RSV plus assay is intended as an aid in the diagnosis of influenza from Nasopharyngeal swab specimens and should not be used as a sole basis for treatment. Nasal washings and aspirates are unacceptable for Xpert Xpress SARS-CoV-2/FLU/RSV testing.  Fact Sheet for Patients: BloggerCourse.com  Fact Sheet for Healthcare Providers: SeriousBroker.it  This test is not yet approved or cleared by the [place] FDA and has been authorized for detection and/or diagnosis of SARS-CoV-2 by FDA under an Emergency Use Authorization (EUA). This EUA will remain in effect (meaning this test can be used) for the duration of the COVID-19 declaration under Section 564(b)(1) of the Act, 21 U.S.C. section 360bbb-3(b)(1), unless the authorization is terminated or revoked.  Performed at [facility], [address], [place], [place] [address]      Time coordinating discharge: Over 30 minutes  SIGNED:   Charise Killian, MD  [facility] 05/06/2021, 12:56 PM Pager   If 7PM-7AM, please contact night-coverage

## 2021-05-15 NOTE — Anesthesia Postprocedure Evaluation (Signed)
Anesthesia Post Note  Patient: Kimberly Stokes  Procedure(s) Performed: INTRAMEDULLARY (IM) NAIL INTERTROCHANTRIC (Left)  Patient location during evaluation: PACU Anesthesia Type: Spinal Level of consciousness: oriented and awake and alert Pain management: pain level controlled Vital Signs Assessment: post-procedure vital signs reviewed and stable Respiratory status: spontaneous breathing and respiratory function stable Cardiovascular status: blood pressure returned to baseline and stable Postop Assessment: no headache, no backache and no apparent nausea or vomiting Anesthetic complications: no   No notable events documented.   Last Vitals:  Vitals:   05/06/21 1155 05/06/21 1601  BP: (!) 107/58 (!) 103/58  Pulse: 70 74  Resp: 16 16  Temp: 37.2 C 36.9 C  SpO2: 100% 100%    Last Pain:  Vitals:   05/06/21 1601  TempSrc: Oral  PainSc:                  Christia Reading

## 2021-11-19 ENCOUNTER — Other Ambulatory Visit: Payer: Self-pay

## 2021-11-19 ENCOUNTER — Emergency Department
Admission: EM | Admit: 2021-11-19 | Discharge: 2021-11-19 | Disposition: A | Discharge status: Home / Self Care | Payer: Medicare Other | Attending: Emergency Medicine | Admitting: Emergency Medicine

## 2021-11-19 ENCOUNTER — Emergency Department: Payer: Medicare Other

## 2021-11-19 DIAGNOSIS — R531 Weakness: Secondary | ICD-10-CM | POA: Diagnosis present

## 2021-11-19 DIAGNOSIS — Z79899 Other long term (current) drug therapy: Secondary | ICD-10-CM | POA: Insufficient documentation

## 2021-11-19 DIAGNOSIS — I129 Hypertensive chronic kidney disease with stage 1 through stage 4 chronic kidney disease, or unspecified chronic kidney disease: Secondary | ICD-10-CM | POA: Insufficient documentation

## 2021-11-19 DIAGNOSIS — R404 Transient alteration of awareness: Secondary | ICD-10-CM | POA: Diagnosis not present

## 2021-11-19 DIAGNOSIS — Z7982 Long term (current) use of aspirin: Secondary | ICD-10-CM | POA: Insufficient documentation

## 2021-11-19 DIAGNOSIS — N183 Chronic kidney disease, stage 3 unspecified: Secondary | ICD-10-CM | POA: Insufficient documentation

## 2021-11-19 DIAGNOSIS — Z87891 Personal history of nicotine dependence: Secondary | ICD-10-CM | POA: Diagnosis not present

## 2021-11-19 LAB — BASIC METABOLIC PANEL
Anion gap: 6 (ref 5–15)
BUN: 23 mg/dL (ref 8–23)
CO2: 29 mmol/L (ref 22–32)
Calcium: 11 mg/dL — ABNORMAL HIGH (ref 8.9–10.3)
Chloride: 100 mmol/L (ref 98–111)
Creatinine, Ser: 0.85 mg/dL (ref 0.44–1.00)
GFR, Estimated: >60 mL/min (ref >60)
Glucose, Bld: 97 mg/dL (ref 70–99)
Potassium: 4.2 mmol/L (ref 3.5–5.1)
Sodium: 135 mmol/L (ref 135–145)

## 2021-11-19 LAB — CBC
HCT: 39.7 % (ref 36.0–46.0)
Hemoglobin: 12.3 g/dL (ref 12.0–15.0)
MCH: 26.7 pg (ref 26.0–34.0)
MCHC: 31 g/dL (ref 30.0–36.0)
MCV: 86.3 fL (ref 80.0–100.0)
Platelets: 256 10*3/uL (ref 150–400)
RBC: 4.6 MIL/uL (ref 3.87–5.11)
RDW: 13.2 % (ref 11.5–15.5)
WBC: 5.3 10*3/uL (ref 4.0–10.5)
nRBC: 0 % (ref 0.0–0.2)

## 2021-11-19 LAB — TROPONIN I (HIGH SENSITIVITY): Troponin I (High Sensitivity): 15 ng/L (ref <18)

## 2021-11-19 NOTE — ED Provider Notes (Signed)
[facility]  ____________________________________________   Event Date/Time   First MD Initiated Contact with Patient 11/19/21 1949     (approximate)  I have reviewed the triage vital signs and the nursing notes.   HISTORY  Chief Complaint Weakness    HPI Kimberly Stokes is a 87 y.o. female with a past medical history of mild dementia and hypertension who presents after an episode of altered mental status.  Patient's daughter is at bedside notes that when she went to see her today the patient was staring off and not responding, while lying in bed.  There was no jerking activity drooling etc.  When EMS got there she was also initially minimally responsive.  However she returned to baseline while in the EMS truck.  She did not have any confusion when she woke up and was immediately back to baseline.  The patient denies complaints right now including chest pain shortness of breath nausea vomiting headache numbness or weakness.  Her daughter notes that she had a similar episode to this before they were not sure what caused it.         Past Medical History:  Diagnosis Date   Arthritis    Constipation    Hypertension     Patient Active Problem List   Diagnosis Date Noted   Femur fracture, left (HCC) 04/30/2021   Hypertension    Arthritis    CKD (chronic kidney disease) stage 3, GFR 30-59 ml/min (HCC)    Hypercalcemia     Past Surgical History:  Procedure Laterality Date   ABDOMINAL HYSTERECTOMY     CATARACT EXTRACTION, BILATERAL     INTRAMEDULLARY (IM) NAIL INTERTROCHANTERIC Left 05/01/2021   Procedure: INTRAMEDULLARY (IM) NAIL INTERTROCHANTRIC;  Surgeon: Juanell Fairly, MD;  Location: [facility] ORS;  Service: Orthopedics;  Laterality: Left;    Prior to Admission medications   Medication Sig Start Date End Date Taking? Authorizing Provider  aspirin 81 MG chewable tablet Chew 81 mg by mouth daily.    [provider]  chlorthalidone (HYGROTON)  25 MG tablet Take 25 mg by mouth daily.    [provider]  enoxaparin (LOVENOX) 30 MG/0.3ML injection Inject 0.3 mLs (30 mg total) into the skin daily. For DVT prophylaxis for femur fracture repair. Should stay on this the entire time pt is in SNF as per ortho surg 05/07/21   Charise Killian, MD  famotidine (PEPCID) 40 MG tablet Take 1 tablet (40 mg total) by mouth daily. 05/07/21   Charise Killian, MD  metoprolol succinate (TOPROL-XL) 25 MG 24 hr tablet Take 25 mg by mouth daily.    [provider]  mirtazapine (REMERON) 7.5 MG tablet Take 7.5 mg by mouth at bedtime.    [provider]  Multiple Vitamins-Minerals (MULTIVITAMIN WITH MINERALS) tablet Take 1 tablet by mouth daily.    [provider]  omeprazole (PRILOSEC) 20 MG capsule Take 20 mg by mouth daily.    [provider]    Allergies Patient has no known allergies.  Family History  Family history unknown: Yes    Social History Social History   Tobacco Use   Smoking status: Former   Smokeless tobacco: Never  Substance Use Topics   Alcohol use: No   Drug use: No    Review of Systems   Review of Systems  Constitutional:  Negative for chills and fever.  Respiratory:  Negative for shortness of breath.   Cardiovascular:  Negative for chest pain.  Gastrointestinal:  Negative for abdominal pain, nausea and vomiting.  Neurological:  Negative for dizziness, seizures, syncope, speech difficulty and headaches.  All other systems reviewed and are negative.  Physical Exam Updated Vital Signs BP (!) 159/77    Pulse (!) 56    Temp 97.6 F (36.4 C) (Oral)    Resp 20    SpO2 100%   Physical Exam Vitals and nursing note reviewed.  Constitutional:      General: She is not in acute distress.    Appearance: Normal appearance.  HENT:     Head: Normocephalic and atraumatic.  Eyes:     General: No scleral icterus.    Conjunctiva/sclera: Conjunctivae normal.  Pulmonary:     Effort:  Pulmonary effort is normal. No respiratory distress.     Breath sounds: No stridor.  Musculoskeletal:        General: No deformity or signs of injury.     Cervical back: Normal range of motion.  Skin:    General: Skin is dry.     Coloration: Skin is not jaundiced or pale.  Neurological:     General: No focal deficit present.     Mental Status: She is alert and oriented to person, place, and time. Mental status is at baseline.     Comments: Aox3, nml speech  PERRL, EOMI, face symmetric, nml tongue movement  5/5 strength in the BL upper and lower extremities  Sensation grossly intact in the BL upper and lower extremities  Finger-nose-finger intact BL   Psychiatric:        Mood and Affect: Mood normal.        Behavior: Behavior normal.     LABS (all labs ordered are listed, but only abnormal results are displayed)  Labs Reviewed  BASIC METABOLIC PANEL - Abnormal; Notable for the following components:      Result Value   Calcium 11.0 (*)    All other components within normal limits  CBC  URINALYSIS, ROUTINE W REFLEX MICROSCOPIC  CBG MONITORING, ED  TROPONIN I (HIGH SENSITIVITY)   ____________________________________________  EKG  Normal axis, normal intervals, normal sinus rhythm, biphasic T waves with coved ST segments in V3 through V6 and II, III, and aVF, similar in appearance to prior EKG ____________________________________________  RADIOLOGY I, Madelin Headings, personally viewed and evaluated these images (plain radiographs) as part of my medical decision making, as well as reviewing the written report by the radiologist.  ED MD interpretation:      ____________________________________________   PROCEDURES  Procedure(s) performed (including Critical Care):  Procedures   ____________________________________________   INITIAL IMPRESSION / ASSESSMENT AND PLAN / ED COURSE     Patient is a 87 year old female with hypertension who presents after an  episode of altered mental status.  Apparently she was minimally responsive with eyes open without any jerking activity or drooling.  Per her daughter when EMS arrived she was like this but then returned to baseline during transport.  She did not have any confusion when she woke up and was immediately back to baseline.  Patient's vital signs within normal limits.  She is thin but overall appears well.  She has a nonfocal neurologic exam and is alert and oriented without complaints.  Her EKG has some abnormalities including coved ST segments and a biphasic T wave in V3 through V6 as well as the inferior leads however this is similar in appearance to prior EKGs and patient is asymptomatic. Differential diagnosis includes seizure, syncope, infection or metabolic abnormality.  Will check basic labs and a CT head.  ED head does not have any acute findings.  Her troponin is negative and her CBC and BMP are largely within normal limits.  Ultimately given she is back to baseline and there was no obvious seizure-like activity I do feel that she is appropriate for discharge.    Clinical Course as of 11/19/21 2138  Wed Nov 19, 2021  2054 IMPRESSION: Ethmoid sinus changes as well as mastoid effusion on the left.   Mild atrophic changes.   No other focal abnormality is noted.     [KM]    Clinical Course User Index [KM] Rada Hay, MD     ____________________________________________   FINAL CLINICAL IMPRESSION(S) / ED DIAGNOSES  Final diagnoses:  Transient alteration of awareness     ED Discharge Orders     None        Note:  This document was prepared using Dragon voice recognition software and may include unintentional dictation errors.    Rada Hay, MD 11/19/21 2138

## 2021-11-19 NOTE — Discharge Instructions (Signed)
Your blood work was all reassuring.  Your CAT scan of your brain did not show any acute abnormalities.  Ultimately we are not sure what caused the episode.  If you have another episode, please follow-up with your primary care provider as you may need to see a neurologist to have an evaluation for seizures.

## 2021-11-19 NOTE — ED Triage Notes (Signed)
Pt comes into the ED via EMS from [facility] health care, called out by staff saying the pt was unresponsive, a/ox3,, pt is at baseline on arrival.   CBG117 98.1 temo 154/75 HR59 100%RA

## 2021-11-19 NOTE — ED Notes (Signed)
Pt alert, daughter states pt was found at nursing staring off into space.  Unable to talk at the time.  Pt sent to er for eval     pt denies chest pain or sob.  No headache.  Siderails up x 2.

## 2023-03-27 ENCOUNTER — Emergency Department
Admission: EM | Admit: 2023-03-27 | Discharge: 2023-03-28 | Disposition: A | Discharge status: Home / Self Care | Payer: Medicare Other | Attending: Emergency Medicine | Admitting: Emergency Medicine

## 2023-03-27 ENCOUNTER — Other Ambulatory Visit: Payer: Self-pay

## 2023-03-27 ENCOUNTER — Emergency Department: Payer: Medicare Other

## 2023-03-27 DIAGNOSIS — Z0000 Encounter for general adult medical examination without abnormal findings: Secondary | ICD-10-CM | POA: Insufficient documentation

## 2023-03-27 DIAGNOSIS — Z79899 Other long term (current) drug therapy: Secondary | ICD-10-CM | POA: Diagnosis not present

## 2023-03-27 DIAGNOSIS — N183 Chronic kidney disease, stage 3 unspecified: Secondary | ICD-10-CM | POA: Insufficient documentation

## 2023-03-27 DIAGNOSIS — M25551 Pain in right hip: Secondary | ICD-10-CM | POA: Diagnosis present

## 2023-03-27 DIAGNOSIS — I129 Hypertensive chronic kidney disease with stage 1 through stage 4 chronic kidney disease, or unspecified chronic kidney disease: Secondary | ICD-10-CM | POA: Insufficient documentation

## 2023-03-27 DIAGNOSIS — Z7982 Long term (current) use of aspirin: Secondary | ICD-10-CM | POA: Insufficient documentation

## 2023-03-27 DIAGNOSIS — Z139 Encounter for screening, unspecified: Secondary | ICD-10-CM

## 2023-03-27 NOTE — ED Triage Notes (Signed)
BIB [facility] from [facility]. Facility reports pt fell a few days ago and pt c/o R hip pain. Pt had imaging at facility today but pt family requested pt come to ER for evaluation because no result yet.   HR 85 148/92 98.6 temp 96% RA

## 2023-03-27 NOTE — ED Triage Notes (Signed)
Charge Note: This RN received call from after hours provider for [facility] stating if not acute fx found or "if just a UTI found", requesting that patient be sent back. Explained patient just arrived to ED, states will check back later.

## 2023-03-28 NOTE — ED Notes (Signed)
[facility]  called  for  transport  to  [facility] °

## 2023-03-28 NOTE — Discharge Instructions (Signed)
Take your medicines as directed by your doctor.  Return to the ER for worsening symptoms, persistent vomiting, difficulty breathing or other concerns. 

## 2023-03-28 NOTE — ED Provider Notes (Signed)
[facility] Provider Note    Event Date/Time   First MD Initiated Contact with Patient 03/28/23 0002     (approximate)   History   Fall   HPI  Level V caveat: Limited by dementia Majority of history obtained from paperwork  Kimberly Stokes is a 89 y.o. female brought to the ED via EMS from [facility] for evaluation of right hip pain.  Facility reports patient fell a few days ago and had been complaining of right hip pain.  Patient had x-rays at the facility today but patient's family requested ER evaluation because they have not yet received results of her x-rays.  Unable to obtain history from patient because she just wants to sleep and yells, punches and kicks at me when I tried to talk to her or examine her.     Past Medical History   Past Medical History:  Diagnosis Date   Arthritis    Constipation    Hypertension      Active Problem List   Patient Active Problem List   Diagnosis Date Noted   Femur fracture, left (HCC) 04/30/2021   Hypertension    Arthritis    CKD (chronic kidney disease) stage 3, GFR 30-59 ml/min (HCC)    Hypercalcemia      Past Surgical History   Past Surgical History:  Procedure Laterality Date   ABDOMINAL HYSTERECTOMY     CATARACT EXTRACTION, BILATERAL     INTRAMEDULLARY (IM) NAIL INTERTROCHANTERIC Left 05/01/2021   Procedure: INTRAMEDULLARY (IM) NAIL INTERTROCHANTRIC;  Surgeon: Juanell Fairly, MD;  Location: [facility] ORS;  Service: Orthopedics;  Laterality: Left;     Home Medications   Prior to Admission medications   Medication Sig Start Date End Date Taking? Authorizing Provider  aspirin 81 MG chewable tablet Chew 81 mg by mouth daily.    [provider]  chlorthalidone (HYGROTON) 25 MG tablet Take 25 mg by mouth daily.    [provider]  enoxaparin (LOVENOX) 30 MG/0.3ML injection Inject 0.3 mLs (30 mg total) into the skin daily. For DVT prophylaxis for femur fracture  repair. Should stay on this the entire time pt is in SNF as per ortho surg 05/07/21   Charise Killian, MD  famotidine (PEPCID) 40 MG tablet Take 1 tablet (40 mg total) by mouth daily. 05/07/21   Charise Killian, MD  metoprolol succinate (TOPROL-XL) 25 MG 24 hr tablet Take 25 mg by mouth daily.    [provider]  mirtazapine (REMERON) 7.5 MG tablet Take 7.5 mg by mouth at bedtime.    [provider]  Multiple Vitamins-Minerals (MULTIVITAMIN WITH MINERALS) tablet Take 1 tablet by mouth daily.    [provider]  omeprazole (PRILOSEC) 20 MG capsule Take 20 mg by mouth daily.    [provider]     Allergies  Patient has no known allergies.   Family History   Family History  Family history unknown: Yes     Physical Exam  Triage Vital Signs: ED Triage Vitals  Enc Vitals Group     BP 03/27/23 2017 (!) 152/79     Pulse Rate 03/27/23 2017 86     Resp 03/27/23 2017 16     Temp 03/27/23 2017 98.3 F (36.8 C)     Temp Source 03/27/23 2017 Oral     SpO2 03/27/23 2017 94 %     Weight 03/27/23 2017 110 lb (49.9 kg)     Height 03/27/23 2017 5'  2" (1.575 m)     Head Circumference --      Peak Flow --      Pain Score 03/27/23 2016 6     Pain Loc --      Pain Edu? --      Excl. in GC? --     Updated Vital Signs: BP (!) 144/78   Pulse 81   Temp 98 F (36.7 C)   Resp 18   Ht 5\' 2"  (1.575 m)   Wt 49.9 kg   SpO2 96%   BMI 20.12 kg/m    General: Asleep, awakened for exam, no distress.  CV:  RRR.  Good peripheral perfusion.  Resp:  Normal effort.  CTAB. Abd:  No distention.  Other:  Uncooperative.  Yells, punches and kicks me with both legs when I try to examine her.  Right hip noted to have good range of motion and patient does not seem to be uncomfortable when moving her right hip to kick me.  Patient calms down immediately once I step back from the bed and leave her alone.   ED Results / Procedures / Treatments  Labs (all labs ordered  are listed, but only abnormal results are displayed) Labs Reviewed - No data to display   EKG  None   RADIOLOGY I have independently visualized and interpreted patient's x-ray as well as noted the radiology interpretation:  Hip x-ray: No acute osseous abnormality  Official radiology report(s): DG Hip Unilat  With Pelvis 2-3 Views Right  Result Date: 03/27/2023 CLINICAL DATA:  Fall EXAM: DG HIP (WITH OR WITHOUT PELVIS) 2-3V RIGHT COMPARISON:  05/01/2021, 04/30/2021 FINDINGS: Previous intramedullary rodding of left femur. Pubic symphysis and rami appear intact. Calcified gallstones in the right mid quadrant. No definitive fracture or malalignment. IMPRESSION: 1. No acute osseous abnormality. Cross-sectional follow-up may be performed if continued suspicion for fracture 2. Calcified gallstones in the right mid quadrant. Electronically Signed   By: Jasmine Pang M.D.   On: 03/27/2023 20:52     PROCEDURES:  Critical Care performed: No  Procedures   MEDICATIONS ORDERED IN ED: Medications - No data to display   IMPRESSION / MDM / ASSESSMENT AND PLAN / ED COURSE  I reviewed the triage vital signs and the nursing notes.                             89 year old female with dementia presenting for evaluation of right hip pain status post fall several days ago.  Differential diagnosis includes but is not limited to fracture, dislocation, contusion.  Personally reviewed patient's records and note a left hip surgery from 04/30/2021.  Patient's presentation is most consistent with acute, uncomplicated illness.  Although patient is uncooperative, she is observed to have full range of motion with her right hip and leg without difficulty.  Will discharge back to facility for follow-up with her PCP.     Clinical Course as of 03/28/23 1610  Wynelle Link Mar 28, 2023  [id number] Addendum on chart review: Patient's granddaughter was at bedside so I was able to update her on x-ray results and gave her strict  return precautions. [JS]    Clinical Course User Index [JS] Irean Hong, MD     FINAL CLINICAL IMPRESSION(S) / ED DIAGNOSES   Final diagnoses:  Encounter for medical screening examination  Right hip pain     Rx / DC Orders   ED Discharge Orders  None        Note:  This document was prepared using Dragon voice recognition software and may include unintentional dictation errors.   Irean Hong, MD 03/28/23 [phone]

## 2023-05-20 IMAGING — CT CT HEAD W/O CM
4 series · 16 of 47 positions shown, 18 images · non-contrast
Comparison: 04/30/2021

CLINICAL DATA: Altered mental status, initial encounter

EXAM:
CT HEAD WITHOUT CONTRAST
TECHNIQUE: Contiguous axial images were obtained from the base of the skull
through the vertex without intravenous contrast.

[Series 2: head wo · axial · 0.42mm/px · z∈[-159,-54]mm · 7 of 29 slices shown, 9 images]
[im 4/29  brain]
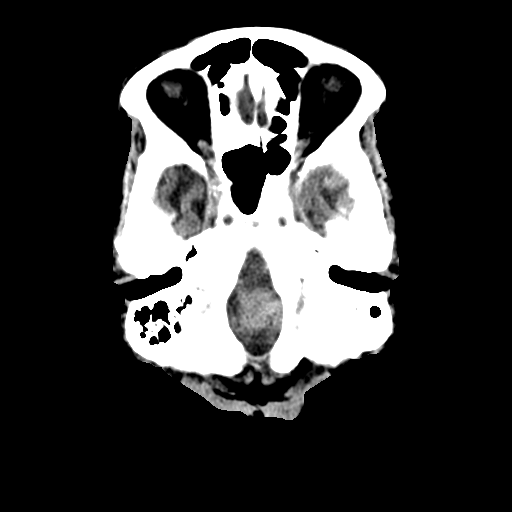
[im 4/29  bone]
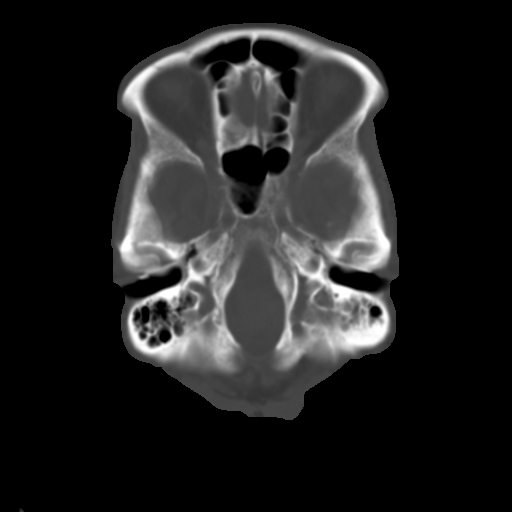
[im 8/29  brain]
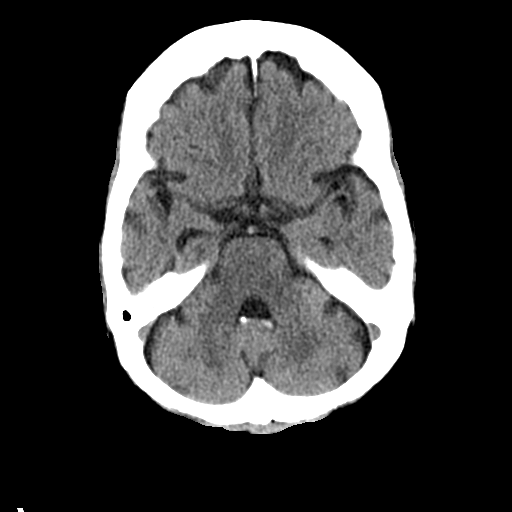
[im 11/29  brain]
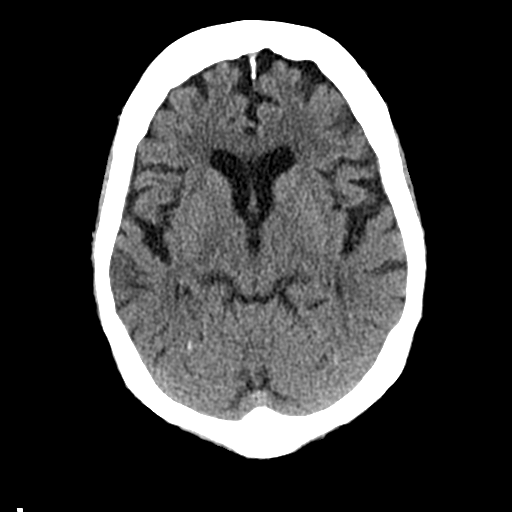
[im 15/29  brain]
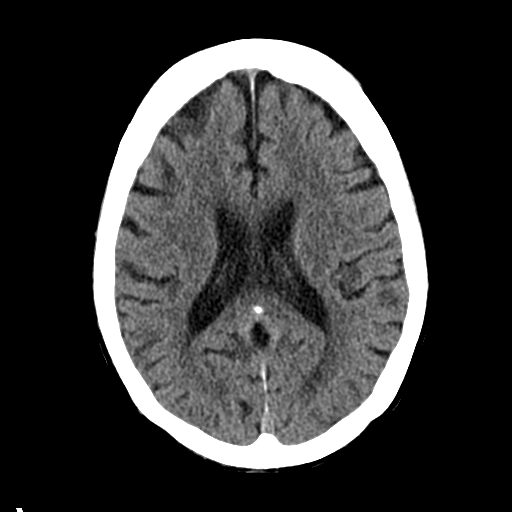
[im 18/29  brain]
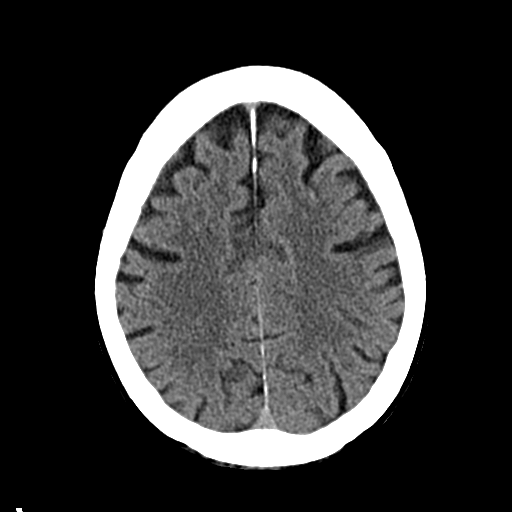
[im 18/29  bone]
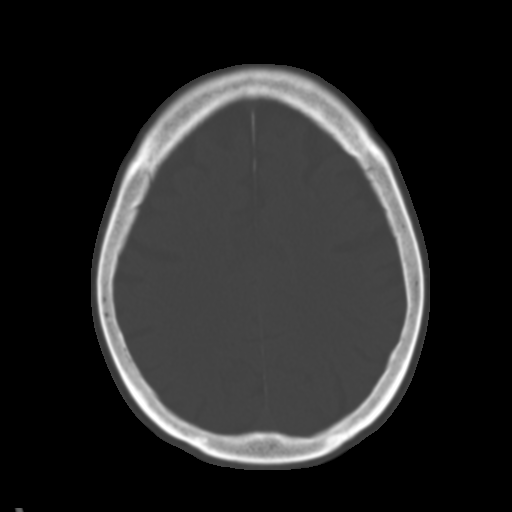
[im 22/29  brain]
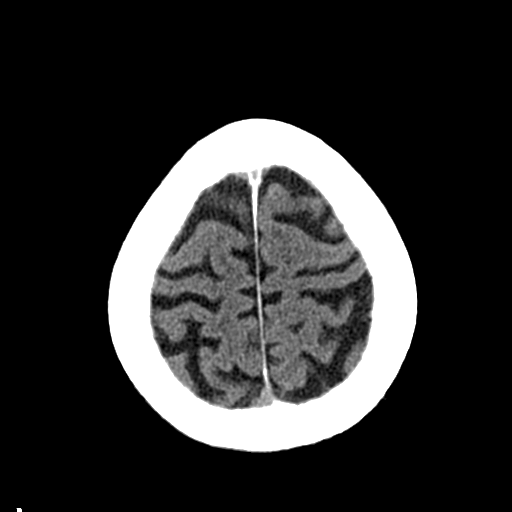
[im 25/29  brain]
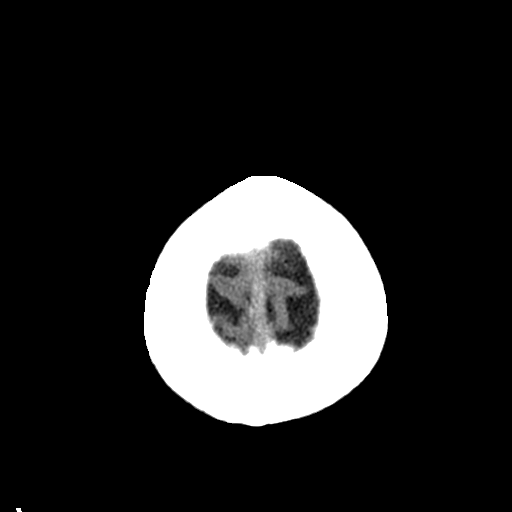

[Series 3: head bone · axial · 0.42mm/px · z∈[-160,-132]mm · 3 of 73 slices shown]
[im 8/73  bone]
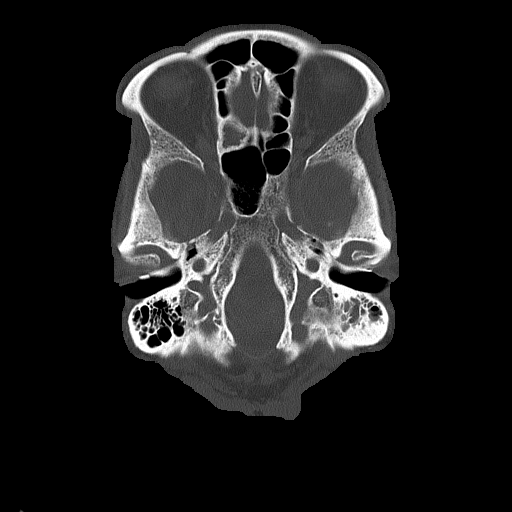
[im 15/73  bone]
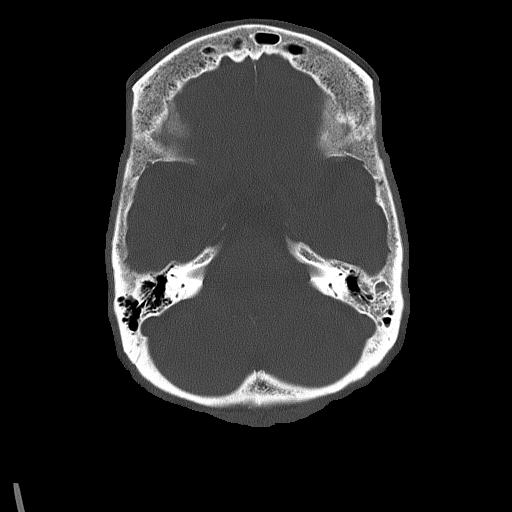
[im 22/73  bone]
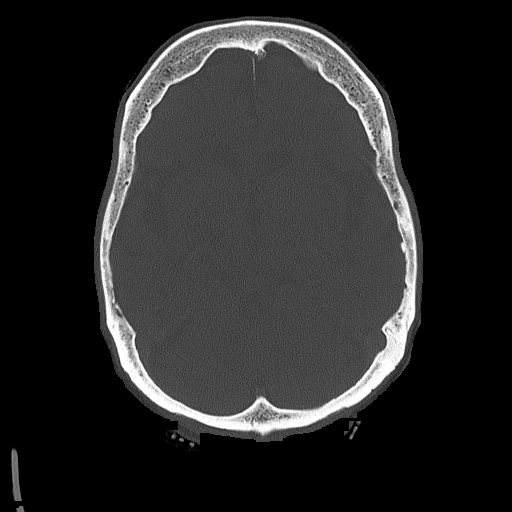

[Series 4: coronal soft tissue · coronal · 0.29mm/px · 3 of 66 slices shown]
[im 22/66  brain]
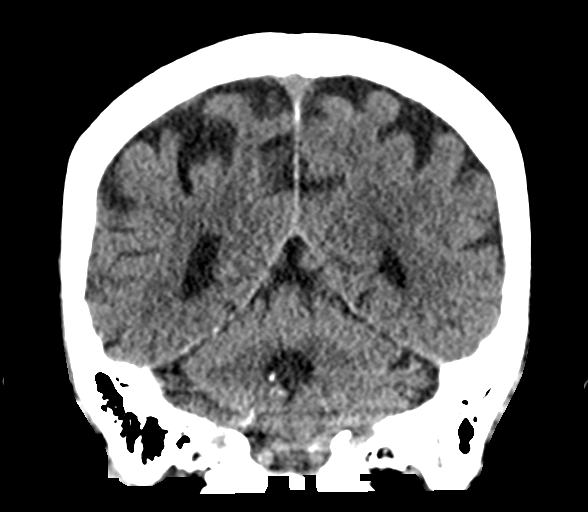
[im 29/66  brain]
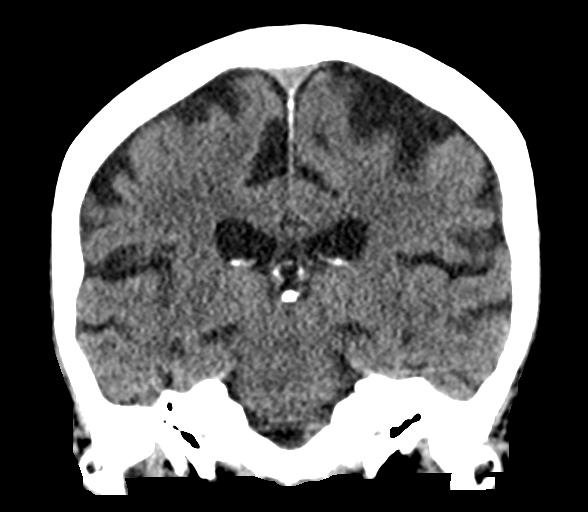
[im 37/66  brain]
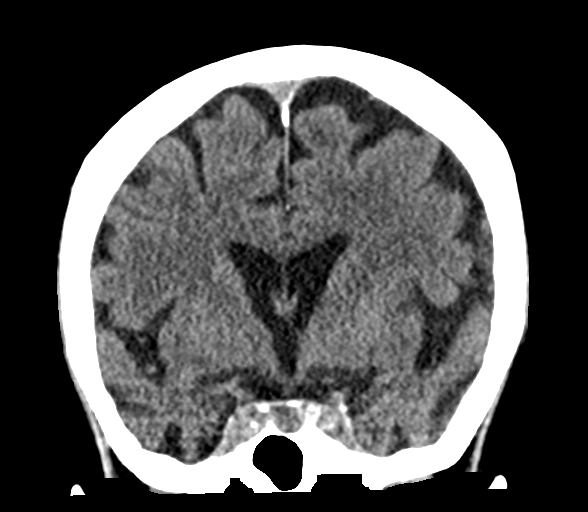

[Series 5: sagittal soft tissue · sagittal · 0.29mm/px · 3 of 51 slices shown]
[im 17/51  brain]
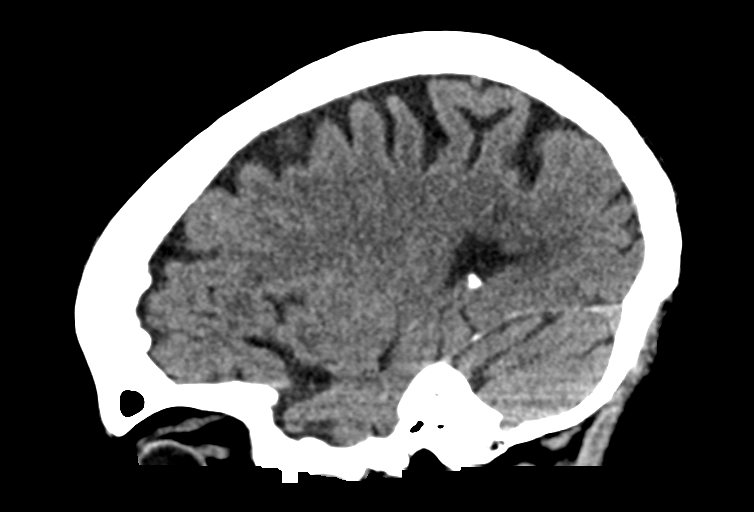
[im 26/51  brain]
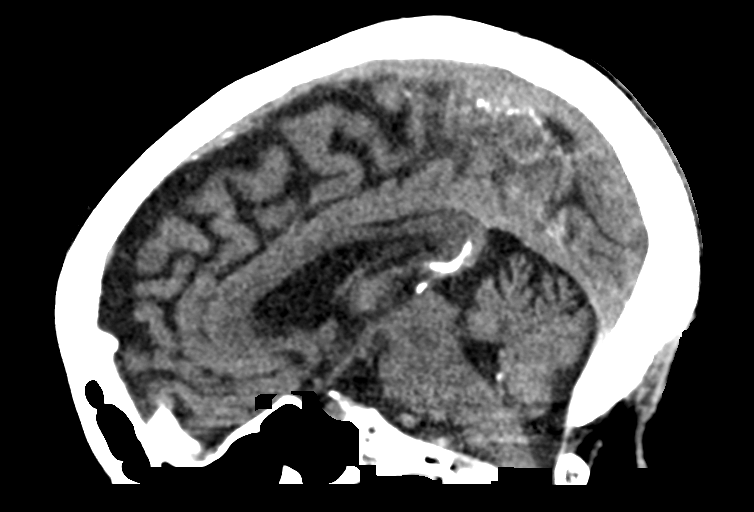
[im 34/51  brain]
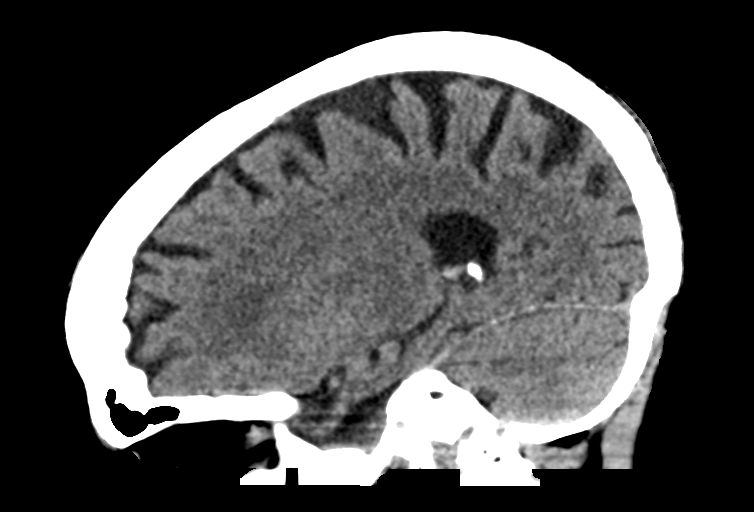

[16 of 47 positions shown; findings below may reference images not displayed]

FINDINGS: Brain: No evidence of acute infarction, hemorrhage, hydrocephalus,
extra-axial collection or mass lesion/mass effect. Mild atrophic
changes are noted.

Vascular: No hyperdense vessel or unexpected calcification.

Skull: Normal. Negative for fracture or focal lesion.

Sinuses/Orbits: Orbits are within normal limits. Some scattered
mucosal thickening is noted within the ethmoid sinuses. Mastoid
effusion is noted on the left new from the prior exam.

Other: None.
IMPRESSION: Ethmoid sinus changes as well as mastoid effusion on the left.

Mild atrophic changes.

No other focal abnormality is noted.

## 2023-05-31 DEATH — deceased
